# Patient Record
Sex: Male | Born: 1940
Health system: Southern US, Community
[De-identification: ages and names within clinical notes are randomized; demographics above are authoritative.]

## PROBLEM LIST (undated history)

## (undated) DIAGNOSIS — M1990 Unspecified osteoarthritis, unspecified site: Secondary | ICD-10-CM

## (undated) DIAGNOSIS — R918 Other nonspecific abnormal finding of lung field: Secondary | ICD-10-CM

## (undated) DIAGNOSIS — I10 Essential (primary) hypertension: Secondary | ICD-10-CM

## (undated) DIAGNOSIS — R972 Elevated prostate specific antigen [PSA]: Secondary | ICD-10-CM

## (undated) DIAGNOSIS — E7800 Pure hypercholesterolemia, unspecified: Secondary | ICD-10-CM

## (undated) DIAGNOSIS — N400 Enlarged prostate without lower urinary tract symptoms: Secondary | ICD-10-CM

## (undated) HISTORY — DX: Pure hypercholesterolemia, unspecified: E78.00

## (undated) HISTORY — PX: TONSILLECTOMY: SUR1361

## (undated) HISTORY — DX: Elevated prostate specific antigen (PSA): R97.20

## (undated) HISTORY — DX: Other nonspecific abnormal finding of lung field: R91.8

## (undated) HISTORY — PX: APPENDECTOMY: SHX54

## (undated) HISTORY — DX: Benign prostatic hyperplasia without lower urinary tract symptoms: N40.0

---

## 2006-06-10 ENCOUNTER — Ambulatory Visit (HOSPITAL_COMMUNITY): Admission: RE | Admit: 2006-06-10 | Discharge: 2006-06-10 | Payer: Self-pay | Admitting: Urology

## 2006-06-16 ENCOUNTER — Inpatient Hospital Stay (HOSPITAL_COMMUNITY): Admission: RE | Admit: 2006-06-16 | Discharge: 2006-06-17 | Payer: Self-pay | Admitting: Urology

## 2006-06-16 ENCOUNTER — Encounter (INDEPENDENT_AMBULATORY_CARE_PROVIDER_SITE_OTHER): Payer: Self-pay | Admitting: Specialist

## 2008-05-04 ENCOUNTER — Ambulatory Visit: Payer: Self-pay | Admitting: Cardiology

## 2010-02-27 ENCOUNTER — Ambulatory Visit (HOSPITAL_COMMUNITY)
Admission: RE | Admit: 2010-02-27 | Discharge: 2010-02-27 | Payer: Self-pay | Source: Home / Self Care | Admitting: Orthopedic Surgery

## 2010-04-28 ENCOUNTER — Other Ambulatory Visit: Payer: Self-pay | Admitting: Thoracic Surgery

## 2010-04-28 DIAGNOSIS — R911 Solitary pulmonary nodule: Secondary | ICD-10-CM

## 2010-04-30 ENCOUNTER — Ambulatory Visit
Admission: RE | Admit: 2010-04-30 | Discharge: 2010-04-30 | Disposition: A | Discharge status: Home / Self Care | Payer: No Typology Code available for payment source | Source: Ambulatory Visit | Attending: Thoracic Surgery | Admitting: Thoracic Surgery

## 2010-04-30 ENCOUNTER — Encounter: Payer: Medicare Other | Admitting: Thoracic Surgery

## 2010-04-30 DIAGNOSIS — R911 Solitary pulmonary nodule: Secondary | ICD-10-CM

## 2010-04-30 DIAGNOSIS — D381 Neoplasm of uncertain behavior of trachea, bronchus and lung: Secondary | ICD-10-CM

## 2010-05-12 NOTE — Letter (Signed)
April 30, 2010  Xaje A. Hasanaj, MD [address] [place], [place]  [address]  Re:  [name], [name]              DOB:  June 14, 1940  Dear Dr. Olena Leatherwood,  Mr. Mcadory came for followup of pulmonary nodules.  He apparently had prostate cancer 2 years ago when he had prostate surgery.  A CT scan revealed some left lower lobe nodules that were 7 mm in size and marginated and this has not changed in size.  He was not followed up and then recently, they have discovered that there has not been a followup when he was trying to get insurance.  For this reason, he is sent here for another evaluation.  We got a CT scan today that showed 2 nodules on the left side which  was 7.4 mm and had not changed in size.  However, there were 2 nodules on the right side, one was 7 mm in the right lower lobe and a 7 mm in that left upper lobe that also was slightly marginated and looks similar to the one on the left side.  He has also a small vertebral hemangioma over T12.  No other lesions were seen.  There was no adenopathy.  The patient is a known smoker, quit smoking 28 years ago.  Does not drink alcohol on a regular basis.  According to the radiologist recommendations, which I agree with, he is at low risk for cancer and so we could probably ought to repeat this and do at least 1 more followup of this in between 6 to 9 months, so I will schedule him for 9 months for followup.  He had no hemoptysis, fever, chills, excessive sputum.  MEDICATIONS:  Simvastatin 40 mg a day, doxycycline 100 mg twice a day, prednisone 20 mg a day, and he has also been on his Keflex 5 mg twice a day.  ALLERGIES:  He has no allergies.  PAST MEDICAL HISTORY:  He has hypercholesterolemia.  FAMILY HISTORY:  Noncontributory.  SOCIAL HISTORY:  He is married.  He has 1 child.  He quit smoking 28 years ago.  REVIEW OF SYSTEMS:  VITAL SIGNS:  He is 196 pounds, he is 5 feet 11 inches.  GENERAL:  His weight has been stable.   CARDIAC:  He had showed no angina or atrial fibrillation.  He has some shortness of breath with exertion.  PULMONARY:  See history of present illness.  GI:  No nausea, vomiting, constipation, or diarrhea.  GU:  No kidney disease, dysuria, or frequent urination.  VASCULAR:  No claudication, DVT, TIAs. NEUROLOGIC:  No dizziness, headaches, blackouts, seizures. MUSCULOSKELETAL:  No joint pain or arthritis.  PSYCHIATRIC:  No depression or nervousness.  ENT:  He has no changes in eyesight or hearing.  HEMATOLOGIC:  No problems with bleeding or clotting disorders.  HABITS:  GENERAL:  He is a well-developed Caucasian male in no acute distress. VITAL SIGNS:  His blood pressure is 141/90, pulse 78, respirations 20, sats were 95%. HEAD, EYES, EARS, NOSE, AND THROAT:  Unremarkable. NECK:  Supple without thyromegaly.  There is no supraclavicular or axillary adenopathy. CHEST:  Clear to auscultation and percussion. HEART:  Regular sinus rhythm.  No murmurs. ABDOMEN:  Soft.  There is no hepatosplenomegaly. EXTREMITIES:  Pulses are 2+.  There is no clubbing or edema. NEUROLOGIC:  She is oriented x3.  Sensory and motor are intact.  Cranial nerves intact.  As mentioned, I think  this is probably stable pulmonary nodules but we do need to get another definite followup in 9 months.  I will schedule this for followup here at our office.  I explained this in great detail to he and his family and they agree.  I appreciate the opportunity of seeing Mr. Natal.  Ines Bloomer, M.D. Electronically Signed  DPB/MEDQ  D:  04/30/2010  T:  05/01/2010  Job:  [id number]  cc:   Dr. Kathrynn Ducking

## 2010-07-23 DIAGNOSIS — Z0279 Encounter for issue of other medical certificate: Secondary | ICD-10-CM

## 2010-08-15 NOTE — H&P (Signed)
[name], [name]              ACCOUNT NO.:  [id number]   MEDICAL RECORD NO.:  [id number]          PATIENT TYPE:  INP   LOCATION:  A315                          FACILITY:  [facility]   PHYSICIAN:  Ky Barban, M.D.DATE OF BIRTH:  [birth year]   DATE OF ADMISSION:  DATE OF DISCHARGE:  LH                              HISTORY & PHYSICAL   CHIEF COMPLAINT:  Symptoms of prostatism.   HISTORY:  This 70 year old gentleman has recurrent urinary tract  infections. Workup showed that he has enlarged prostate causing bladder  neck obstruction.  He does have symptoms of prostatism which has been  going on for a long time with slow and weak stream, nocturia, frequency.  Two weeks ago he had urinary tract infection.  He had fever and chills.  Treated with antibiotics.  He was placed on Proscar and Flomax.  His  symptoms have not improved much.  Cystoscopy shows that he has enlarged  prostate, but I did not see very significantly enlarged prostate.  There  is tight bladder neck, so I advised him to undergo holmium laser  ablation.  Procedure and complications discussed.  They understand and  want me to go ahead and schedule.   The other problem is that his PSA is also elevated to 10.9.  I want to  get rid of these symptoms of prostatism and see in 2-3 months if PSA  comes down.  If not, then we will have to do a biopsy.  He understands  that.   PAST MEDICAL HISTORY:  1. Appendectomy in 1972.  2. Vasectomy in 1977.  3. Scrotal cyst removed in 2002.   PERSONAL HISTORY:  Does not smoke or drink.   REVIEW OF SYSTEMS:  Unremarkable.   PHYSICAL EXAMINATION:  VITAL SIGNS: Blood pressure 120/80, temperature  normal.  CENTRAL NERVOUS SYSTEM:  No gross neurological deficit.  HEAD, NECK, EYES, ENT:  Negative.  CHEST: Symmetrical.  HEART:  Regular.  S1, S2 normal.  No murmur.  ABDOMEN:  Soft, flat.  Liver, spleen, kidneys are not palpable.  No CVA  tenderness.  EXTERNAL GENITALIA:  He is  uncircumcised.  Meatus is adequate.  Testicles are normal.  RECTAL: Prostate 1.5 plus, smooth and firm.   IMPRESSION:  1. Benign prostatic hypertrophy.  2. Bladder neck obstruction.  3. Recurrent urinary tract infections.   PLAN:  Holmium laser ablation of the prostate and keep him overnight in  the hospital.      Ky Barban, M.D.  Electronically Signed     MIJ/MEDQ  D:  06/15/2006  T:  06/15/2006  Job:  [id number]

## 2010-08-15 NOTE — Discharge Summary (Signed)
[name], [place]              ACCOUNT NO.:  [id number]   MEDICAL RECORD NO.:  [id number]          PATIENT TYPE:  INP   LOCATION:  A315                          FACILITY:  [facility]   PHYSICIAN:  Ky Barban, M.D.DATE OF BIRTH:  12-10-1940   DATE OF ADMISSION:  06/16/2006  DATE OF DISCHARGE:  03/20/2008LH                               DISCHARGE SUMMARY   HISTORY OF PRESENT ILLNESS:  A 70 year old gentleman has longstanding  history of prostatism he was worked up in the office and he has enlarged  prostate causing bladder neck obstruction.  His flow rate is very slow,  and I advised him to undergo Holmium laser ablation of the prostate.  He  also has recent urinary tract infection.  He has been treated with  Flomax and Proscar, but continued to be symptomatic.  He also has  elevated PSA of 10.9.  his bladder does not look empty, so I have told  him that after his bladder starts emptying we will check the PSA again.  If continues to be high, then he would need a biopsy.  Otherwise, he is  in good medical condition.   HOSPITAL COURSE:  A routine preadmission workup CBC, urinalysis, SMA7  were done, which were normal.  He was taken to the operating room.  Holmium laser ablation of the prostate was done but during the procedure  I found out that he still has significant tissue, so I decided to resect  that, especially I need some also tissue from his prostate for biopsy  purposes.  Although, we will not do the GI workup and needle biopsy  transrectally, but it is still good to have this tissue.  So, I decided  to go ahead and do TUR prostate which was done.  Postop course was  benign.  His pathology report came back as BPH.  By next day, his urine  is clear.  We took out the Foley catheter.  He is voiding fine.  So, I  am going to discharge him home and will follow-up him in the office.   FINAL DISCHARGE DIAGNOSIS:  1. Benign prostatic hypertrophy.  2. Elevated PSA.   PLAN:  I  will him back in two weeks in the office.  I told him if  feeling fever to let me know.      Ky Barban, M.D.  Electronically Signed     MIJ/MEDQ  D:  07/01/2006  T:  07/01/2006  Job:  [id number]

## 2010-08-15 NOTE — Op Note (Signed)
[name], [name]              ACCOUNT NO.:  [id number]   MEDICAL RECORD NO.:  [id number]          PATIENT TYPE:  INP   LOCATION:  A315                          FACILITY:  [facility]   PHYSICIAN:  Ky Barban, M.D.DATE OF BIRTH:  1941/02/22   DATE OF PROCEDURE:  06/16/2006  DATE OF DISCHARGE:                               OPERATIVE REPORT   PREOPERATIVE DIAGNOSIS:  Benign prostatic hypertrophy, elevated PSA.   POSTOPERATIVE DIAGNOSIS:  Benign prostatic hypertrophy, elevated PSA.   PROCEDURE:  Holmium laser ablation of the prostate, transurethral  resection of the prostate.   ANESTHESIA:  Spinal.   DESCRIPTION OF PROCEDURE:  The patient was given spinal anesthesia,  placed in lithotomy position, prepped and draped.  A #26 Iglesias  resectoscope sheath was introduced into the bladder.  It was inspected.  It was pulled back in the mid prostatic urethra.  Then, I introduced the  laser bridge into the resectoscope sheath and using a laser fiber,  ablated the bladder neck circumferentially.  The resectoscope sheath was  pulled back to the level of the verumontanum.  The right lobe was  ablated between 11 and 7 o'clock position.  Similarly, the left lobe was  ablated between the 1 and 5 o'clock position.  I see a lot of flaky  tissue floating around and there is considerable prostatic tissue.  This  patient has elevated PSA, so I decided to go ahead and resect this  tissue to make it smooth.  So, I introduced #26 working element with the  loop into it and the right and left lobe of the prostate was carefully  resected making sure not to injure the sphincter or the verumontanum.  After this, I used the laser again to cauterize any bleeders and the  prostatic urethra looks wide open.  There were a lot of prostatic chips  which were evacuated with the help of the [facility] evacuator.  Now, the  resectoscope was removed and a 22 three way Foley catheter was left in  for drainage, through  and through started which is clear.  The patient  left the operating room in satisfactory condition.      Ky Barban, M.D.  Electronically Signed     MIJ/MEDQ  D:  06/16/2006  T:  06/16/2006  Job:  [id number]

## 2010-12-19 ENCOUNTER — Other Ambulatory Visit: Payer: Self-pay | Admitting: Thoracic Surgery

## 2010-12-19 DIAGNOSIS — D381 Neoplasm of uncertain behavior of trachea, bronchus and lung: Secondary | ICD-10-CM

## 2011-01-21 ENCOUNTER — Encounter: Payer: Self-pay | Admitting: Thoracic Surgery

## 2011-01-27 DIAGNOSIS — N400 Enlarged prostate without lower urinary tract symptoms: Secondary | ICD-10-CM | POA: Insufficient documentation

## 2011-01-27 DIAGNOSIS — E7800 Pure hypercholesterolemia, unspecified: Secondary | ICD-10-CM | POA: Insufficient documentation

## 2011-01-27 DIAGNOSIS — R972 Elevated prostate specific antigen [PSA]: Secondary | ICD-10-CM | POA: Insufficient documentation

## 2011-01-27 DIAGNOSIS — R918 Other nonspecific abnormal finding of lung field: Secondary | ICD-10-CM | POA: Insufficient documentation

## 2011-01-27 DIAGNOSIS — E785 Hyperlipidemia, unspecified: Secondary | ICD-10-CM | POA: Insufficient documentation

## 2011-01-28 ENCOUNTER — Ambulatory Visit
Admission: RE | Admit: 2011-01-28 | Discharge: 2011-01-28 | Disposition: A | Discharge status: Home / Self Care | Payer: Medicare Other | Source: Ambulatory Visit | Attending: Thoracic Surgery | Admitting: Thoracic Surgery

## 2011-01-28 ENCOUNTER — Other Ambulatory Visit: Payer: No Typology Code available for payment source

## 2011-01-28 ENCOUNTER — Encounter: Payer: Self-pay | Admitting: Thoracic Surgery

## 2011-01-28 ENCOUNTER — Ambulatory Visit (INDEPENDENT_AMBULATORY_CARE_PROVIDER_SITE_OTHER): Payer: Medicare Other | Admitting: Thoracic Surgery

## 2011-01-28 ENCOUNTER — Ambulatory Visit: Payer: Medicare Other | Admitting: Thoracic Surgery

## 2011-01-28 VITALS — BP 129/80 | HR 65 | Resp 18 | Ht 71.0 in | Wt 180.0 lb

## 2011-01-28 DIAGNOSIS — D381 Neoplasm of uncertain behavior of trachea, bronchus and lung: Secondary | ICD-10-CM

## 2011-01-28 DIAGNOSIS — R918 Other nonspecific abnormal finding of lung field: Secondary | ICD-10-CM

## 2011-01-28 NOTE — Progress Notes (Signed)
HPI [name] returns today for followup of his lung nodules. His PSA has been stable he has no medical problems. CT scan showed no change in the multiple nodules. We will see him back in 6 months for final check.   Current Outpatient Prescriptions  Medication Sig Dispense Refill  . CELEBREX 200 MG capsule 200 mg daily.       . simvastatin (ZOCOR) 40 MG tablet Take 40 mg by mouth at bedtime.        Marland Kitchen ZOSTAVAX [address] UNT/0.65ML injection          Review of Systems: Unchanged   Physical Exam  Cardiovascular: Normal rate, regular rhythm and normal heart sounds.   Pulmonary/Chest: Effort normal and breath sounds normal. No respiratory distress.     Diagnostic Tests: CT scan shows that the 2 nodules in the left and the 2 nodules on the right were unchanged   Impression: Prostate cancer multiple pulmonary nodules stable   Plan: Return in 6 months with a CT scan

## 2011-02-25 DIAGNOSIS — Z0271 Encounter for disability determination: Secondary | ICD-10-CM

## 2011-04-21 DIAGNOSIS — J449 Chronic obstructive pulmonary disease, unspecified: Secondary | ICD-10-CM | POA: Diagnosis not present

## 2011-04-21 DIAGNOSIS — E782 Mixed hyperlipidemia: Secondary | ICD-10-CM | POA: Diagnosis not present

## 2011-04-21 DIAGNOSIS — H65199 Other acute nonsuppurative otitis media, unspecified ear: Secondary | ICD-10-CM | POA: Diagnosis not present

## 2011-04-23 DIAGNOSIS — Z23 Encounter for immunization: Secondary | ICD-10-CM | POA: Diagnosis not present

## 2011-05-29 DIAGNOSIS — J019 Acute sinusitis, unspecified: Secondary | ICD-10-CM | POA: Diagnosis not present

## 2011-05-29 DIAGNOSIS — R51 Headache: Secondary | ICD-10-CM | POA: Diagnosis not present

## 2011-05-29 DIAGNOSIS — J209 Acute bronchitis, unspecified: Secondary | ICD-10-CM | POA: Diagnosis not present

## 2011-06-03 ENCOUNTER — Other Ambulatory Visit: Payer: Self-pay | Admitting: Thoracic Surgery

## 2011-06-03 DIAGNOSIS — D381 Neoplasm of uncertain behavior of trachea, bronchus and lung: Secondary | ICD-10-CM

## 2011-07-03 ENCOUNTER — Other Ambulatory Visit: Payer: Self-pay | Admitting: Cardiothoracic Surgery

## 2011-07-07 ENCOUNTER — Encounter: Payer: Self-pay | Admitting: Thoracic Surgery

## 2011-07-07 ENCOUNTER — Ambulatory Visit (INDEPENDENT_AMBULATORY_CARE_PROVIDER_SITE_OTHER): Payer: Medicare Other | Admitting: Thoracic Surgery

## 2011-07-07 ENCOUNTER — Ambulatory Visit: Payer: Medicare Other | Admitting: Thoracic Surgery

## 2011-07-07 ENCOUNTER — Ambulatory Visit
Admission: RE | Admit: 2011-07-07 | Discharge: 2011-07-07 | Disposition: A | Discharge status: Home / Self Care | Payer: Medicare Other | Source: Ambulatory Visit | Attending: Thoracic Surgery | Admitting: Thoracic Surgery

## 2011-07-07 VITALS — BP 123/68 | HR 80 | Resp 18 | Ht 71.0 in | Wt 186.0 lb

## 2011-07-07 DIAGNOSIS — R918 Other nonspecific abnormal finding of lung field: Secondary | ICD-10-CM | POA: Diagnosis not present

## 2011-07-07 DIAGNOSIS — D381 Neoplasm of uncertain behavior of trachea, bronchus and lung: Secondary | ICD-10-CM

## 2011-07-07 NOTE — Progress Notes (Signed)
HPI patient returns for followup today. CT scan shows no change in the multiple nodules these and been stable for over 2 years. We will let them be followed up by the medical Dr. will be happy to see him again should  anything change. He has had no change in his symptoms since we saw him last. I feel these nodules are probably benign probably granulomas.   Current Outpatient Prescriptions  Medication Sig Dispense Refill  . CELEBREX 200 MG capsule 200 mg daily.       . simvastatin (ZOCOR) 40 MG tablet Take 40 mg by mouth at bedtime.        Marland Kitchen ZOSTAVAX 46962 UNT/0.65ML injection          Review of Systems: Unchanged   Physical Exam chest is clear to auscultation percussion    Diagnostic Tests: CT scan shows no change in the multiple nodules they thought that one might be slightly more prominent   Impression: multiple lung nodules probably benign    PlanFollowup by PCP:

## 2011-07-21 DIAGNOSIS — J449 Chronic obstructive pulmonary disease, unspecified: Secondary | ICD-10-CM | POA: Diagnosis not present

## 2011-07-23 DIAGNOSIS — Z0271 Encounter for disability determination: Secondary | ICD-10-CM

## 2011-10-29 DIAGNOSIS — Z79899 Other long term (current) drug therapy: Secondary | ICD-10-CM | POA: Diagnosis not present

## 2011-10-29 DIAGNOSIS — N390 Urinary tract infection, site not specified: Secondary | ICD-10-CM | POA: Diagnosis not present

## 2011-10-29 DIAGNOSIS — E876 Hypokalemia: Secondary | ICD-10-CM | POA: Diagnosis not present

## 2011-11-02 DIAGNOSIS — N3000 Acute cystitis without hematuria: Secondary | ICD-10-CM | POA: Diagnosis not present

## 2011-11-02 DIAGNOSIS — E782 Mixed hyperlipidemia: Secondary | ICD-10-CM | POA: Diagnosis not present

## 2012-01-18 DIAGNOSIS — T1500XA Foreign body in cornea, unspecified eye, initial encounter: Secondary | ICD-10-CM | POA: Diagnosis not present

## 2012-02-02 DIAGNOSIS — E782 Mixed hyperlipidemia: Secondary | ICD-10-CM | POA: Diagnosis not present

## 2012-02-17 DIAGNOSIS — H538 Other visual disturbances: Secondary | ICD-10-CM | POA: Diagnosis not present

## 2012-02-17 DIAGNOSIS — H2510 Age-related nuclear cataract, unspecified eye: Secondary | ICD-10-CM | POA: Diagnosis not present

## 2012-04-23 DIAGNOSIS — J019 Acute sinusitis, unspecified: Secondary | ICD-10-CM | POA: Diagnosis not present

## 2012-04-23 DIAGNOSIS — J209 Acute bronchitis, unspecified: Secondary | ICD-10-CM | POA: Diagnosis not present

## 2012-05-06 DIAGNOSIS — I10 Essential (primary) hypertension: Secondary | ICD-10-CM | POA: Diagnosis not present

## 2012-05-06 DIAGNOSIS — Z0000 Encounter for general adult medical examination without abnormal findings: Secondary | ICD-10-CM | POA: Diagnosis not present

## 2012-05-06 DIAGNOSIS — Z125 Encounter for screening for malignant neoplasm of prostate: Secondary | ICD-10-CM | POA: Diagnosis not present

## 2012-05-06 DIAGNOSIS — E782 Mixed hyperlipidemia: Secondary | ICD-10-CM | POA: Diagnosis not present

## 2012-05-09 DIAGNOSIS — Z23 Encounter for immunization: Secondary | ICD-10-CM | POA: Diagnosis not present

## 2012-08-01 DIAGNOSIS — H2510 Age-related nuclear cataract, unspecified eye: Secondary | ICD-10-CM | POA: Diagnosis not present

## 2012-08-05 DIAGNOSIS — E782 Mixed hyperlipidemia: Secondary | ICD-10-CM | POA: Diagnosis not present

## 2012-08-15 DIAGNOSIS — H269 Unspecified cataract: Secondary | ICD-10-CM | POA: Diagnosis not present

## 2012-08-15 DIAGNOSIS — H2510 Age-related nuclear cataract, unspecified eye: Secondary | ICD-10-CM | POA: Diagnosis not present

## 2012-08-30 DIAGNOSIS — H2510 Age-related nuclear cataract, unspecified eye: Secondary | ICD-10-CM | POA: Diagnosis not present

## 2012-09-01 DIAGNOSIS — H269 Unspecified cataract: Secondary | ICD-10-CM | POA: Diagnosis not present

## 2012-09-01 DIAGNOSIS — H52229 Regular astigmatism, unspecified eye: Secondary | ICD-10-CM | POA: Diagnosis not present

## 2012-09-01 DIAGNOSIS — H2510 Age-related nuclear cataract, unspecified eye: Secondary | ICD-10-CM | POA: Diagnosis not present

## 2012-11-02 DIAGNOSIS — IMO0002 Reserved for concepts with insufficient information to code with codable children: Secondary | ICD-10-CM | POA: Diagnosis not present

## 2012-11-02 DIAGNOSIS — M48061 Spinal stenosis, lumbar region without neurogenic claudication: Secondary | ICD-10-CM | POA: Diagnosis not present

## 2012-11-02 DIAGNOSIS — M545 Low back pain: Secondary | ICD-10-CM | POA: Diagnosis not present

## 2012-11-02 DIAGNOSIS — M5137 Other intervertebral disc degeneration, lumbosacral region: Secondary | ICD-10-CM | POA: Diagnosis not present

## 2012-11-11 DIAGNOSIS — M47817 Spondylosis without myelopathy or radiculopathy, lumbosacral region: Secondary | ICD-10-CM | POA: Diagnosis not present

## 2012-11-11 DIAGNOSIS — M5137 Other intervertebral disc degeneration, lumbosacral region: Secondary | ICD-10-CM | POA: Diagnosis not present

## 2012-11-30 DIAGNOSIS — M5137 Other intervertebral disc degeneration, lumbosacral region: Secondary | ICD-10-CM | POA: Diagnosis not present

## 2012-12-14 DIAGNOSIS — M5137 Other intervertebral disc degeneration, lumbosacral region: Secondary | ICD-10-CM | POA: Diagnosis not present

## 2013-01-02 DIAGNOSIS — M5137 Other intervertebral disc degeneration, lumbosacral region: Secondary | ICD-10-CM | POA: Diagnosis not present

## 2013-03-13 DIAGNOSIS — E782 Mixed hyperlipidemia: Secondary | ICD-10-CM | POA: Diagnosis not present

## 2013-03-13 DIAGNOSIS — Z125 Encounter for screening for malignant neoplasm of prostate: Secondary | ICD-10-CM | POA: Diagnosis not present

## 2013-03-30 HISTORY — PX: CATARACT EXTRACTION: SUR2

## 2013-04-24 DIAGNOSIS — M545 Low back pain, unspecified: Secondary | ICD-10-CM | POA: Diagnosis not present

## 2013-04-24 DIAGNOSIS — M5137 Other intervertebral disc degeneration, lumbosacral region: Secondary | ICD-10-CM | POA: Diagnosis not present

## 2013-04-24 DIAGNOSIS — G894 Chronic pain syndrome: Secondary | ICD-10-CM | POA: Diagnosis not present

## 2013-04-24 DIAGNOSIS — M48061 Spinal stenosis, lumbar region without neurogenic claudication: Secondary | ICD-10-CM | POA: Diagnosis not present

## 2013-05-03 DIAGNOSIS — M545 Low back pain, unspecified: Secondary | ICD-10-CM | POA: Diagnosis not present

## 2013-05-04 DIAGNOSIS — M999 Biomechanical lesion, unspecified: Secondary | ICD-10-CM | POA: Diagnosis not present

## 2013-05-04 DIAGNOSIS — M5430 Sciatica, unspecified side: Secondary | ICD-10-CM | POA: Diagnosis not present

## 2013-05-04 DIAGNOSIS — M47817 Spondylosis without myelopathy or radiculopathy, lumbosacral region: Secondary | ICD-10-CM | POA: Diagnosis not present

## 2013-05-08 DIAGNOSIS — M999 Biomechanical lesion, unspecified: Secondary | ICD-10-CM | POA: Diagnosis not present

## 2013-05-08 DIAGNOSIS — M5430 Sciatica, unspecified side: Secondary | ICD-10-CM | POA: Diagnosis not present

## 2013-05-08 DIAGNOSIS — M47817 Spondylosis without myelopathy or radiculopathy, lumbosacral region: Secondary | ICD-10-CM | POA: Diagnosis not present

## 2013-05-11 DIAGNOSIS — M47817 Spondylosis without myelopathy or radiculopathy, lumbosacral region: Secondary | ICD-10-CM | POA: Diagnosis not present

## 2013-05-11 DIAGNOSIS — M999 Biomechanical lesion, unspecified: Secondary | ICD-10-CM | POA: Diagnosis not present

## 2013-05-11 DIAGNOSIS — M5430 Sciatica, unspecified side: Secondary | ICD-10-CM | POA: Diagnosis not present

## 2013-05-18 DIAGNOSIS — M47817 Spondylosis without myelopathy or radiculopathy, lumbosacral region: Secondary | ICD-10-CM | POA: Diagnosis not present

## 2013-05-18 DIAGNOSIS — M999 Biomechanical lesion, unspecified: Secondary | ICD-10-CM | POA: Diagnosis not present

## 2013-05-18 DIAGNOSIS — M5430 Sciatica, unspecified side: Secondary | ICD-10-CM | POA: Diagnosis not present

## 2013-05-23 DIAGNOSIS — M999 Biomechanical lesion, unspecified: Secondary | ICD-10-CM | POA: Diagnosis not present

## 2013-05-23 DIAGNOSIS — M5430 Sciatica, unspecified side: Secondary | ICD-10-CM | POA: Diagnosis not present

## 2013-05-23 DIAGNOSIS — M47817 Spondylosis without myelopathy or radiculopathy, lumbosacral region: Secondary | ICD-10-CM | POA: Diagnosis not present

## 2013-05-30 DIAGNOSIS — M999 Biomechanical lesion, unspecified: Secondary | ICD-10-CM | POA: Diagnosis not present

## 2013-05-30 DIAGNOSIS — M5430 Sciatica, unspecified side: Secondary | ICD-10-CM | POA: Diagnosis not present

## 2013-05-30 DIAGNOSIS — M47817 Spondylosis without myelopathy or radiculopathy, lumbosacral region: Secondary | ICD-10-CM | POA: Diagnosis not present

## 2013-06-06 DIAGNOSIS — M47817 Spondylosis without myelopathy or radiculopathy, lumbosacral region: Secondary | ICD-10-CM | POA: Diagnosis not present

## 2013-06-06 DIAGNOSIS — M5430 Sciatica, unspecified side: Secondary | ICD-10-CM | POA: Diagnosis not present

## 2013-06-06 DIAGNOSIS — M999 Biomechanical lesion, unspecified: Secondary | ICD-10-CM | POA: Diagnosis not present

## 2013-06-12 DIAGNOSIS — I10 Essential (primary) hypertension: Secondary | ICD-10-CM | POA: Diagnosis not present

## 2013-06-13 DIAGNOSIS — M47817 Spondylosis without myelopathy or radiculopathy, lumbosacral region: Secondary | ICD-10-CM | POA: Diagnosis not present

## 2013-06-13 DIAGNOSIS — M5430 Sciatica, unspecified side: Secondary | ICD-10-CM | POA: Diagnosis not present

## 2013-06-13 DIAGNOSIS — M999 Biomechanical lesion, unspecified: Secondary | ICD-10-CM | POA: Diagnosis not present

## 2013-06-20 DIAGNOSIS — M47817 Spondylosis without myelopathy or radiculopathy, lumbosacral region: Secondary | ICD-10-CM | POA: Diagnosis not present

## 2013-06-20 DIAGNOSIS — M999 Biomechanical lesion, unspecified: Secondary | ICD-10-CM | POA: Diagnosis not present

## 2013-06-20 DIAGNOSIS — M5430 Sciatica, unspecified side: Secondary | ICD-10-CM | POA: Diagnosis not present

## 2013-06-28 DIAGNOSIS — M47817 Spondylosis without myelopathy or radiculopathy, lumbosacral region: Secondary | ICD-10-CM | POA: Diagnosis not present

## 2013-06-28 DIAGNOSIS — M5430 Sciatica, unspecified side: Secondary | ICD-10-CM | POA: Diagnosis not present

## 2013-06-28 DIAGNOSIS — M999 Biomechanical lesion, unspecified: Secondary | ICD-10-CM | POA: Diagnosis not present

## 2013-07-05 DIAGNOSIS — M999 Biomechanical lesion, unspecified: Secondary | ICD-10-CM | POA: Diagnosis not present

## 2013-07-05 DIAGNOSIS — M5430 Sciatica, unspecified side: Secondary | ICD-10-CM | POA: Diagnosis not present

## 2013-07-05 DIAGNOSIS — M47817 Spondylosis without myelopathy or radiculopathy, lumbosacral region: Secondary | ICD-10-CM | POA: Diagnosis not present

## 2013-07-12 DIAGNOSIS — M5430 Sciatica, unspecified side: Secondary | ICD-10-CM | POA: Diagnosis not present

## 2013-07-12 DIAGNOSIS — M47817 Spondylosis without myelopathy or radiculopathy, lumbosacral region: Secondary | ICD-10-CM | POA: Diagnosis not present

## 2013-07-12 DIAGNOSIS — M999 Biomechanical lesion, unspecified: Secondary | ICD-10-CM | POA: Diagnosis not present

## 2013-07-18 DIAGNOSIS — M5430 Sciatica, unspecified side: Secondary | ICD-10-CM | POA: Diagnosis not present

## 2013-07-18 DIAGNOSIS — M999 Biomechanical lesion, unspecified: Secondary | ICD-10-CM | POA: Diagnosis not present

## 2013-07-18 DIAGNOSIS — M47817 Spondylosis without myelopathy or radiculopathy, lumbosacral region: Secondary | ICD-10-CM | POA: Diagnosis not present

## 2013-07-25 DIAGNOSIS — M999 Biomechanical lesion, unspecified: Secondary | ICD-10-CM | POA: Diagnosis not present

## 2013-07-25 DIAGNOSIS — M5430 Sciatica, unspecified side: Secondary | ICD-10-CM | POA: Diagnosis not present

## 2013-07-25 DIAGNOSIS — M47817 Spondylosis without myelopathy or radiculopathy, lumbosacral region: Secondary | ICD-10-CM | POA: Diagnosis not present

## 2013-08-01 DIAGNOSIS — M999 Biomechanical lesion, unspecified: Secondary | ICD-10-CM | POA: Diagnosis not present

## 2013-08-01 DIAGNOSIS — M47817 Spondylosis without myelopathy or radiculopathy, lumbosacral region: Secondary | ICD-10-CM | POA: Diagnosis not present

## 2013-08-01 DIAGNOSIS — M5430 Sciatica, unspecified side: Secondary | ICD-10-CM | POA: Diagnosis not present

## 2013-08-08 DIAGNOSIS — M5430 Sciatica, unspecified side: Secondary | ICD-10-CM | POA: Diagnosis not present

## 2013-08-08 DIAGNOSIS — M999 Biomechanical lesion, unspecified: Secondary | ICD-10-CM | POA: Diagnosis not present

## 2013-08-08 DIAGNOSIS — M47817 Spondylosis without myelopathy or radiculopathy, lumbosacral region: Secondary | ICD-10-CM | POA: Diagnosis not present

## 2013-08-15 DIAGNOSIS — M47817 Spondylosis without myelopathy or radiculopathy, lumbosacral region: Secondary | ICD-10-CM | POA: Diagnosis not present

## 2013-08-15 DIAGNOSIS — M5430 Sciatica, unspecified side: Secondary | ICD-10-CM | POA: Diagnosis not present

## 2013-08-15 DIAGNOSIS — M999 Biomechanical lesion, unspecified: Secondary | ICD-10-CM | POA: Diagnosis not present

## 2013-08-22 DIAGNOSIS — M999 Biomechanical lesion, unspecified: Secondary | ICD-10-CM | POA: Diagnosis not present

## 2013-08-22 DIAGNOSIS — M5430 Sciatica, unspecified side: Secondary | ICD-10-CM | POA: Diagnosis not present

## 2013-08-22 DIAGNOSIS — M47817 Spondylosis without myelopathy or radiculopathy, lumbosacral region: Secondary | ICD-10-CM | POA: Diagnosis not present

## 2013-08-28 DIAGNOSIS — L570 Actinic keratosis: Secondary | ICD-10-CM | POA: Diagnosis not present

## 2013-08-28 DIAGNOSIS — Z0000 Encounter for general adult medical examination without abnormal findings: Secondary | ICD-10-CM | POA: Diagnosis not present

## 2013-08-29 DIAGNOSIS — M47817 Spondylosis without myelopathy or radiculopathy, lumbosacral region: Secondary | ICD-10-CM | POA: Diagnosis not present

## 2013-08-29 DIAGNOSIS — M999 Biomechanical lesion, unspecified: Secondary | ICD-10-CM | POA: Diagnosis not present

## 2013-08-29 DIAGNOSIS — M5430 Sciatica, unspecified side: Secondary | ICD-10-CM | POA: Diagnosis not present

## 2013-09-04 DIAGNOSIS — H26499 Other secondary cataract, unspecified eye: Secondary | ICD-10-CM | POA: Diagnosis not present

## 2013-09-04 DIAGNOSIS — H43399 Other vitreous opacities, unspecified eye: Secondary | ICD-10-CM | POA: Diagnosis not present

## 2013-09-05 DIAGNOSIS — M5430 Sciatica, unspecified side: Secondary | ICD-10-CM | POA: Diagnosis not present

## 2013-09-05 DIAGNOSIS — M47817 Spondylosis without myelopathy or radiculopathy, lumbosacral region: Secondary | ICD-10-CM | POA: Diagnosis not present

## 2013-09-05 DIAGNOSIS — M999 Biomechanical lesion, unspecified: Secondary | ICD-10-CM | POA: Diagnosis not present

## 2013-09-12 DIAGNOSIS — M5430 Sciatica, unspecified side: Secondary | ICD-10-CM | POA: Diagnosis not present

## 2013-09-12 DIAGNOSIS — M999 Biomechanical lesion, unspecified: Secondary | ICD-10-CM | POA: Diagnosis not present

## 2013-09-12 DIAGNOSIS — M47817 Spondylosis without myelopathy or radiculopathy, lumbosacral region: Secondary | ICD-10-CM | POA: Diagnosis not present

## 2013-09-14 DIAGNOSIS — M5137 Other intervertebral disc degeneration, lumbosacral region: Secondary | ICD-10-CM | POA: Diagnosis not present

## 2013-09-14 DIAGNOSIS — M47817 Spondylosis without myelopathy or radiculopathy, lumbosacral region: Secondary | ICD-10-CM | POA: Diagnosis not present

## 2013-09-18 DIAGNOSIS — M5137 Other intervertebral disc degeneration, lumbosacral region: Secondary | ICD-10-CM | POA: Diagnosis not present

## 2013-09-18 DIAGNOSIS — M545 Low back pain, unspecified: Secondary | ICD-10-CM | POA: Diagnosis not present

## 2013-09-18 DIAGNOSIS — M4120 Other idiopathic scoliosis, site unspecified: Secondary | ICD-10-CM | POA: Diagnosis not present

## 2013-09-18 DIAGNOSIS — M47817 Spondylosis without myelopathy or radiculopathy, lumbosacral region: Secondary | ICD-10-CM | POA: Diagnosis not present

## 2013-09-18 DIAGNOSIS — M5126 Other intervertebral disc displacement, lumbar region: Secondary | ICD-10-CM | POA: Diagnosis not present

## 2013-09-19 DIAGNOSIS — M47817 Spondylosis without myelopathy or radiculopathy, lumbosacral region: Secondary | ICD-10-CM | POA: Diagnosis not present

## 2013-09-19 DIAGNOSIS — M999 Biomechanical lesion, unspecified: Secondary | ICD-10-CM | POA: Diagnosis not present

## 2013-09-19 DIAGNOSIS — M5430 Sciatica, unspecified side: Secondary | ICD-10-CM | POA: Diagnosis not present

## 2013-09-21 DIAGNOSIS — M48061 Spinal stenosis, lumbar region without neurogenic claudication: Secondary | ICD-10-CM | POA: Diagnosis not present

## 2013-10-03 DIAGNOSIS — M999 Biomechanical lesion, unspecified: Secondary | ICD-10-CM | POA: Diagnosis not present

## 2013-10-03 DIAGNOSIS — M47817 Spondylosis without myelopathy or radiculopathy, lumbosacral region: Secondary | ICD-10-CM | POA: Diagnosis not present

## 2013-10-03 DIAGNOSIS — M5430 Sciatica, unspecified side: Secondary | ICD-10-CM | POA: Diagnosis not present

## 2013-10-10 DIAGNOSIS — M999 Biomechanical lesion, unspecified: Secondary | ICD-10-CM | POA: Diagnosis not present

## 2013-10-10 DIAGNOSIS — M47817 Spondylosis without myelopathy or radiculopathy, lumbosacral region: Secondary | ICD-10-CM | POA: Diagnosis not present

## 2013-10-10 DIAGNOSIS — M5430 Sciatica, unspecified side: Secondary | ICD-10-CM | POA: Diagnosis not present

## 2013-10-17 DIAGNOSIS — M999 Biomechanical lesion, unspecified: Secondary | ICD-10-CM | POA: Diagnosis not present

## 2013-10-17 DIAGNOSIS — M47817 Spondylosis without myelopathy or radiculopathy, lumbosacral region: Secondary | ICD-10-CM | POA: Diagnosis not present

## 2013-10-17 DIAGNOSIS — M5430 Sciatica, unspecified side: Secondary | ICD-10-CM | POA: Diagnosis not present

## 2013-10-27 ENCOUNTER — Other Ambulatory Visit (HOSPITAL_COMMUNITY): Payer: Self-pay | Admitting: Orthopedic Surgery

## 2013-10-27 ENCOUNTER — Other Ambulatory Visit (HOSPITAL_COMMUNITY): Payer: Self-pay | Admitting: Orthopaedic Surgery

## 2013-11-06 DIAGNOSIS — M5430 Sciatica, unspecified side: Secondary | ICD-10-CM | POA: Diagnosis not present

## 2013-11-06 DIAGNOSIS — M47817 Spondylosis without myelopathy or radiculopathy, lumbosacral region: Secondary | ICD-10-CM | POA: Diagnosis not present

## 2013-11-06 DIAGNOSIS — M999 Biomechanical lesion, unspecified: Secondary | ICD-10-CM | POA: Diagnosis not present

## 2013-11-10 ENCOUNTER — Encounter (HOSPITAL_COMMUNITY): Payer: Self-pay | Admitting: Pharmacy Technician

## 2013-11-13 DIAGNOSIS — M47817 Spondylosis without myelopathy or radiculopathy, lumbosacral region: Secondary | ICD-10-CM | POA: Diagnosis not present

## 2013-11-13 DIAGNOSIS — M999 Biomechanical lesion, unspecified: Secondary | ICD-10-CM | POA: Diagnosis not present

## 2013-11-13 DIAGNOSIS — M5430 Sciatica, unspecified side: Secondary | ICD-10-CM | POA: Diagnosis not present

## 2013-11-13 NOTE — Pre-Procedure Instructions (Signed)
[name]  11/13/2013   Your procedure is scheduled on:  11/22/13  Report to [facility] Admitting at 1030 AM.  Call this number if you have problems the morning of surgery: [phone]   Remember:   Do not eat food or drink liquids after midnight.   Take these medicines the morning of surgery with A SIP OF WATER: none   Do not wear jewelry, make-up or nail polish.  Do not wear lotions, powders, or perfumes. You may wear deodorant.  Do not shave 48 hours prior to surgery. Men may shave face and neck.  Do not bring valuables to the hospital.  [facility] is not responsible                  for any belongings or valuables.               Contacts, dentures or bridgework may not be worn into surgery.  Leave suitcase in the car. After surgery it may be brought to your room.  For patients admitted to the hospital, discharge time is determined by your                treatment team.               Patients discharged the day of surgery will not be allowed to drive  home.  Name and phone number of your driver: family  Special Instructions: Shower using CHG 2 nights before surgery and the night before surgery.  If you shower the day of surgery use CHG.  Use special wash - you have one bottle of CHG for all showers.  You should use approximately 1/3 of the bottle for each shower.   Please read over the following fact sheets that you were given: Pain Booklet, Coughing and Deep Breathing, MRSA Information and Surgical Site Infection Prevention

## 2013-11-14 ENCOUNTER — Encounter (HOSPITAL_COMMUNITY): Payer: Self-pay

## 2013-11-14 ENCOUNTER — Encounter (HOSPITAL_COMMUNITY)
Admission: RE | Admit: 2013-11-14 | Discharge: 2013-11-14 | Disposition: A | Discharge status: Home / Self Care | Payer: Medicare Other | Source: Ambulatory Visit | Attending: Orthopaedic Surgery | Admitting: Orthopaedic Surgery

## 2013-11-14 DIAGNOSIS — M48061 Spinal stenosis, lumbar region without neurogenic claudication: Secondary | ICD-10-CM | POA: Diagnosis not present

## 2013-11-14 DIAGNOSIS — Z01818 Encounter for other preprocedural examination: Secondary | ICD-10-CM | POA: Diagnosis not present

## 2013-11-14 LAB — COMPREHENSIVE METABOLIC PANEL
ALBUMIN: 3.5 g/dL (ref 3.5–5.2)
ALK PHOS: 44 U/L (ref 39–117)
ALT: 21 U/L (ref 0–53)
AST: 23 U/L (ref 0–37)
Anion gap: 13 (ref 5–15)
BILIRUBIN TOTAL: 0.6 mg/dL (ref 0.3–1.2)
BUN: 17 mg/dL (ref 6–23)
CO2: 23 mEq/L (ref 19–32)
Calcium: 9.6 mg/dL (ref 8.4–10.5)
Chloride: 103 mEq/L (ref 96–112)
Creatinine, Ser: 0.79 mg/dL (ref 0.50–1.35)
GFR calc Af Amer: >90 mL/min (ref >90)
GFR calc non Af Amer: 88 mL/min — ABNORMAL LOW (ref >90)
Glucose, Bld: 107 mg/dL — ABNORMAL HIGH (ref 70–99)
POTASSIUM: 4.7 meq/L (ref 3.7–5.3)
SODIUM: 139 meq/L (ref 137–147)
Total Protein: 6.4 g/dL (ref 6.0–8.3)

## 2013-11-14 LAB — CBC
HCT: 40.4 % (ref 39.0–52.0)
Hemoglobin: 14 g/dL (ref 13.0–17.0)
MCH: 33.3 pg (ref 26.0–34.0)
MCHC: 34.7 g/dL (ref 30.0–36.0)
MCV: 96.2 fL (ref 78.0–100.0)
PLATELETS: 177 10*3/uL (ref 150–400)
RBC: 4.2 MIL/uL — ABNORMAL LOW (ref 4.22–5.81)
RDW: 12.6 % (ref 11.5–15.5)
WBC: 5.4 10*3/uL (ref 4.0–10.5)

## 2013-11-14 LAB — SURGICAL PCR SCREEN
MRSA, PCR: NEGATIVE
Staphylococcus aureus: NEGATIVE

## 2013-11-14 LAB — PROTIME-INR
INR: 0.91 (ref 0.00–1.49)
Prothrombin Time: 12.3 seconds (ref 11.6–15.2)

## 2013-11-17 NOTE — H&P (Signed)
[facility]   A Division of [employer] [facility]   [address], [place], [place] [address] Telephone: [phone]  Fax: [phone]     PATIENT: [name], [name]   MR#: [id number]  DOB: [birth year]       A 73 year old male referred by Dr. Sherrie Sport with persistent back pain and leg pain with leg weakness.  He owns [employer], does body work, has had chronic back pain for 15-16 years.  Has difficulty with standing, difficulty with walking, difficulty in the supine position, he has had epidurals, physical therapy, pain medications, anti-inflammatories.     CURRENT MEDICATIONS:  He normally takes Simvastatin plus multivitamins, and Celebrex currently for his anti-inflammatory.   ALLERGIES:  NO KNOWN DRUG ALLERGIES.    PAST MEDICAL/SURGICAL HISTORY:  Surgeries include knee arthroscopy, prostate surgery without cancer, previous vasectomy, appendectomy, tonsillectomy.     SOCIAL HISTORY:  Patient is married to his wife, [name].  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  His son works in the other part of the building, does IT sales professional.     FAMILY HISTORY:  Positive for throat and lung cancer, skin cancer, as well as COPD.     REVIEW OF SYSTEMS:  Positive for cataract problems and benign prostatitic hypertrophy.   PHYSICAL EXAMINATION:  Patient is alert and oriented, WD, WN, NAD, extraocular movement is intact.  No accessory muscle inspiratory effort.  Height is 5 feet 11 inches, weight 184.  Lungs are clear. Heart is clear.  Spine is straight.  There is mild sciatic notch tenderness.  Negative straight leg raising 90 degrees.  He is able to heel and toe walk.  Distal pulses are 2+.  No restriction with hip range of motion.  Negative fabere test.   RADIOGRAPHS:  X-rays demonstrate multilevel spondylosis with some retrolisthesis at L3-4.  MRI scan is available for review from 09/18/2013.  This shows some scoliosis,  multilevel spondylosis.  Patient has moderate lateral recess narrowing bilaterally at L4-5 worse on the right, moderate foraminal stenosis bilateral at 4-5, moderate lateral recess stenosis at 5-1, and moderate central stenosis at L4-5 and L5-S1.     PLAN:  He has been through anti-inflammatories, pain medication, multiple epidural injections over the past 3-years, a total of 5 injections, the last in January 2015, with only temporary relief.   Operative choice would be 2-level L4-5, L5-S1 decompression for central and lateral recess stenosis.  He would be in the hospital 1-2 nights, would be restricted from doing any lifting, bending for a number of weeks.  Problems with voiding with his history of prostate problems discussed.  Risks of anesthesia, risk of stroke, heart attack discussed.        Mark C. Lorin Mercy, M.D.    Auto-Authenticated by Thana Farr. Lorin Mercy, M.D.

## 2013-11-21 ENCOUNTER — Other Ambulatory Visit (HOSPITAL_COMMUNITY): Payer: Self-pay | Admitting: Orthopedic Surgery

## 2013-11-21 MED ORDER — CEFAZOLIN SODIUM-DEXTROSE 2-3 GM-% IV SOLR
2.0000 g | INTRAVENOUS | Status: AC
  Administered 2013-11-22: 2 g via INTRAVENOUS
  Filled 2013-11-21: qty 50

## 2013-11-22 ENCOUNTER — Inpatient Hospital Stay (HOSPITAL_COMMUNITY): Payer: Medicare Other

## 2013-11-22 ENCOUNTER — Encounter (HOSPITAL_COMMUNITY): Payer: Medicare Other | Admitting: Certified Registered Nurse Anesthetist

## 2013-11-22 ENCOUNTER — Observation Stay (HOSPITAL_COMMUNITY)
Admission: RE | Admit: 2013-11-22 | Discharge: 2013-11-23 | Disposition: A | Discharge status: Home / Self Care | Payer: Medicare Other | Source: Ambulatory Visit | Attending: Orthopaedic Surgery | Admitting: Orthopaedic Surgery

## 2013-11-22 ENCOUNTER — Encounter (HOSPITAL_COMMUNITY)
Admission: RE | Disposition: A | Discharge status: Home / Self Care | Payer: Self-pay | Source: Ambulatory Visit | Attending: Orthopaedic Surgery

## 2013-11-22 ENCOUNTER — Encounter (HOSPITAL_COMMUNITY): Payer: Self-pay | Admitting: *Deleted

## 2013-11-22 ENCOUNTER — Inpatient Hospital Stay (HOSPITAL_COMMUNITY): Payer: Medicare Other | Admitting: Certified Registered Nurse Anesthetist

## 2013-11-22 DIAGNOSIS — M47817 Spondylosis without myelopathy or radiculopathy, lumbosacral region: Secondary | ICD-10-CM | POA: Diagnosis not present

## 2013-11-22 DIAGNOSIS — M48061 Spinal stenosis, lumbar region without neurogenic claudication: Secondary | ICD-10-CM | POA: Diagnosis not present

## 2013-11-22 DIAGNOSIS — M4800 Spinal stenosis, site unspecified: Secondary | ICD-10-CM | POA: Diagnosis present

## 2013-11-22 DIAGNOSIS — Z87891 Personal history of nicotine dependence: Secondary | ICD-10-CM | POA: Diagnosis not present

## 2013-11-22 DIAGNOSIS — M5137 Other intervertebral disc degeneration, lumbosacral region: Secondary | ICD-10-CM | POA: Diagnosis not present

## 2013-11-22 DIAGNOSIS — M519 Unspecified thoracic, thoracolumbar and lumbosacral intervertebral disc disorder: Secondary | ICD-10-CM | POA: Diagnosis not present

## 2013-11-22 HISTORY — PX: LUMBAR LAMINECTOMY: SHX95

## 2013-11-22 LAB — URINALYSIS, ROUTINE W REFLEX MICROSCOPIC
Bilirubin Urine: NEGATIVE
Glucose, UA: NEGATIVE mg/dL
Hgb urine dipstick: NEGATIVE
Ketones, ur: NEGATIVE mg/dL
Leukocytes, UA: NEGATIVE
Nitrite: NEGATIVE
Protein, ur: NEGATIVE mg/dL
Specific Gravity, Urine: 1.015 (ref 1.005–1.030)
Urobilinogen, UA: 0.2 mg/dL (ref 0.0–1.0)
pH: 7.5 (ref 5.0–8.0)

## 2013-11-22 SURGERY — MICRODISCECTOMY LUMBAR LAMINECTOMY
Anesthesia: General | Site: Back

## 2013-11-22 MED ORDER — PROMETHAZINE HCL 25 MG/ML IJ SOLN
INTRAMUSCULAR | Status: AC
  Filled 2013-11-22: qty 1

## 2013-11-22 MED ORDER — PHENOL 1.4 % MT LIQD: 1.0000 | OROMUCOSAL | Status: DC | PRN

## 2013-11-22 MED ORDER — THROMBIN 20000 UNITS EX SOLR
CUTANEOUS | Status: AC
  Filled 2013-11-22: qty 20000

## 2013-11-22 MED ORDER — SODIUM CHLORIDE 0.9 % IV SOLN: 250.0000 mL | INTRAVENOUS | Status: DC

## 2013-11-22 MED ORDER — LACTATED RINGERS IV SOLN
INTRAVENOUS | Status: DC | PRN
  Administered 2013-11-22 (×2): via INTRAVENOUS

## 2013-11-22 MED ORDER — SODIUM CHLORIDE 0.9 % IJ SOLN: 3.0000 mL | Freq: Two times a day (BID) | INTRAMUSCULAR | Status: DC

## 2013-11-22 MED ORDER — OXYCODONE-ACETAMINOPHEN 5-325 MG PO TABS: 1.0000 | ORAL_TABLET | ORAL | Status: DC | PRN

## 2013-11-22 MED ORDER — KCL IN DEXTROSE-NACL 20-5-0.45 MEQ/L-%-% IV SOLN
INTRAVENOUS | Status: DC
  Administered 2013-11-22: 19:00:00 via INTRAVENOUS
  Filled 2013-11-22 (×4): qty 1000

## 2013-11-22 MED ORDER — ACETAMINOPHEN 650 MG RE SUPP
650.0000 mg | RECTAL | Status: DC | PRN
Start: 2013-11-22 — End: 2013-11-23

## 2013-11-22 MED ORDER — HYDROMORPHONE HCL PF 1 MG/ML IJ SOLN
INTRAMUSCULAR | Status: AC
  Filled 2013-11-22: qty 1

## 2013-11-22 MED ORDER — SIMVASTATIN 40 MG PO TABS
40.0000 mg | ORAL_TABLET | Freq: Every day | ORAL | Status: DC
  Administered 2013-11-22: 40 mg via ORAL
  Filled 2013-11-22 (×2): qty 1

## 2013-11-22 MED ORDER — MIDAZOLAM HCL 2 MG/2ML IJ SOLN
INTRAMUSCULAR | Status: AC
  Filled 2013-11-22: qty 2

## 2013-11-22 MED ORDER — PROMETHAZINE HCL 25 MG/ML IJ SOLN
6.2500 mg | INTRAMUSCULAR | Status: DC | PRN
  Administered 2013-11-22: 6.25 mg via INTRAVENOUS

## 2013-11-22 MED ORDER — ONDANSETRON HCL 4 MG/2ML IJ SOLN
INTRAMUSCULAR | Status: AC
  Filled 2013-11-22: qty 2

## 2013-11-22 MED ORDER — SURGIFOAM 100 EX MISC
CUTANEOUS | Status: DC | PRN
  Administered 2013-11-22: 14:00:00 via TOPICAL

## 2013-11-22 MED ORDER — FLEET ENEMA 7-19 GM/118ML RE ENEM: 1.0000 | ENEMA | Freq: Once | RECTAL | Status: AC | PRN

## 2013-11-22 MED ORDER — LIDOCAINE HCL (CARDIAC) 20 MG/ML IV SOLN
INTRAVENOUS | Status: AC
  Filled 2013-11-22: qty 5

## 2013-11-22 MED ORDER — GLYCOPYRROLATE 0.2 MG/ML IJ SOLN
INTRAMUSCULAR | Status: AC
  Filled 2013-11-22: qty 4

## 2013-11-22 MED ORDER — NEOSTIGMINE METHYLSULFATE 10 MG/10ML IV SOLN
INTRAVENOUS | Status: DC | PRN
  Administered 2013-11-22: 5 mg via INTRAVENOUS

## 2013-11-22 MED ORDER — HYDROCODONE-ACETAMINOPHEN 5-325 MG PO TABS: 1.0000 | ORAL_TABLET | ORAL | Status: DC | PRN

## 2013-11-22 MED ORDER — BISACODYL 10 MG RE SUPP: 10.0000 mg | Freq: Every day | RECTAL | Status: DC | PRN

## 2013-11-22 MED ORDER — ACETAMINOPHEN 325 MG PO TABS: 650.0000 mg | ORAL_TABLET | ORAL | Status: DC | PRN

## 2013-11-22 MED ORDER — METHOCARBAMOL 500 MG PO TABS: 500.0000 mg | ORAL_TABLET | Freq: Four times a day (QID) | ORAL | Status: DC | PRN

## 2013-11-22 MED ORDER — OXYCODONE-ACETAMINOPHEN 5-325 MG PO TABS
1.0000 | ORAL_TABLET | ORAL | Status: DC | PRN
  Administered 2013-11-23 (×2): 2 via ORAL
  Filled 2013-11-22 (×2): qty 2

## 2013-11-22 MED ORDER — HYDROMORPHONE HCL PF 1 MG/ML IJ SOLN
0.2500 mg | INTRAMUSCULAR | Status: DC | PRN
  Administered 2013-11-22 (×2): 0.5 mg via INTRAVENOUS

## 2013-11-22 MED ORDER — PROPOFOL 10 MG/ML IV BOLUS
INTRAVENOUS | Status: DC | PRN
  Administered 2013-11-22: 130 mg via INTRAVENOUS

## 2013-11-22 MED ORDER — ARTIFICIAL TEARS OP OINT
TOPICAL_OINTMENT | OPHTHALMIC | Status: AC
  Filled 2013-11-22: qty 3.5

## 2013-11-22 MED ORDER — CEFAZOLIN SODIUM 1-5 GM-% IV SOLN
1.0000 g | Freq: Three times a day (TID) | INTRAVENOUS | Status: AC
  Administered 2013-11-22 – 2013-11-23 (×2): 1 g via INTRAVENOUS
  Filled 2013-11-22 (×2): qty 50

## 2013-11-22 MED ORDER — ROCURONIUM BROMIDE 100 MG/10ML IV SOLN
INTRAVENOUS | Status: DC | PRN
  Administered 2013-11-22: 40 mg via INTRAVENOUS

## 2013-11-22 MED ORDER — FENTANYL CITRATE 0.05 MG/ML IJ SOLN
INTRAMUSCULAR | Status: AC
  Filled 2013-11-22: qty 5

## 2013-11-22 MED ORDER — ONDANSETRON HCL 4 MG/2ML IJ SOLN: 4.0000 mg | INTRAMUSCULAR | Status: DC | PRN

## 2013-11-22 MED ORDER — ZOLPIDEM TARTRATE 5 MG PO TABS: 5.0000 mg | ORAL_TABLET | Freq: Every evening | ORAL | Status: DC | PRN

## 2013-11-22 MED ORDER — FENTANYL CITRATE 0.05 MG/ML IJ SOLN
INTRAMUSCULAR | Status: DC | PRN
  Administered 2013-11-22 (×2): 100 ug via INTRAVENOUS

## 2013-11-22 MED ORDER — SODIUM CHLORIDE 0.9 % IJ SOLN: 3.0000 mL | INTRAMUSCULAR | Status: DC | PRN

## 2013-11-22 MED ORDER — MENTHOL 3 MG MT LOZG: 1.0000 | LOZENGE | OROMUCOSAL | Status: DC | PRN

## 2013-11-22 MED ORDER — ALBUMIN HUMAN 5 % IV SOLN
INTRAVENOUS | Status: DC | PRN
  Administered 2013-11-22: 14:00:00 via INTRAVENOUS

## 2013-11-22 MED ORDER — DOCUSATE SODIUM 100 MG PO CAPS
100.0000 mg | ORAL_CAPSULE | Freq: Two times a day (BID) | ORAL | Status: DC
  Administered 2013-11-22 – 2013-11-23 (×2): 100 mg via ORAL
  Filled 2013-11-22 (×2): qty 1

## 2013-11-22 MED ORDER — GLYCOPYRROLATE 0.2 MG/ML IJ SOLN
INTRAMUSCULAR | Status: DC | PRN
  Administered 2013-11-22: .6 mg via INTRAVENOUS

## 2013-11-22 MED ORDER — POLYETHYLENE GLYCOL 3350 17 G PO PACK: 17.0000 g | PACK | Freq: Every day | ORAL | Status: DC | PRN

## 2013-11-22 MED ORDER — MORPHINE SULFATE 2 MG/ML IJ SOLN
1.0000 mg | INTRAMUSCULAR | Status: DC | PRN
  Administered 2013-11-22: 2 mg via INTRAVENOUS
  Filled 2013-11-22: qty 1

## 2013-11-22 MED ORDER — METHOCARBAMOL 1000 MG/10ML IJ SOLN
500.0000 mg | Freq: Four times a day (QID) | INTRAVENOUS | Status: DC | PRN
  Filled 2013-11-22: qty 5

## 2013-11-22 MED ORDER — PHENYLEPHRINE HCL 10 MG/ML IJ SOLN
10.0000 mg | INTRAVENOUS | Status: DC | PRN
  Administered 2013-11-22: 20 ug/min via INTRAVENOUS

## 2013-11-22 MED ORDER — METHOCARBAMOL 500 MG PO TABS
500.0000 mg | ORAL_TABLET | Freq: Four times a day (QID) | ORAL | Status: DC | PRN
  Administered 2013-11-23: 500 mg via ORAL
  Filled 2013-11-22 (×2): qty 1

## 2013-11-22 MED ORDER — KETOROLAC TROMETHAMINE 30 MG/ML IJ SOLN
30.0000 mg | Freq: Three times a day (TID) | INTRAMUSCULAR | Status: DC
  Administered 2013-11-22 – 2013-11-23 (×2): 30 mg via INTRAVENOUS
  Filled 2013-11-22 (×3): qty 1

## 2013-11-22 MED ORDER — LIDOCAINE HCL (CARDIAC) 20 MG/ML IV SOLN
INTRAVENOUS | Status: DC | PRN
  Administered 2013-11-22: 60 mg via INTRAVENOUS

## 2013-11-22 MED ORDER — EPHEDRINE SULFATE 50 MG/ML IJ SOLN
INTRAMUSCULAR | Status: AC
  Filled 2013-11-22: qty 1

## 2013-11-22 MED ORDER — ONDANSETRON HCL 4 MG/2ML IJ SOLN
INTRAMUSCULAR | Status: DC | PRN
  Administered 2013-11-22: 4 mg via INTRAVENOUS

## 2013-11-22 MED ORDER — LACTATED RINGERS IV SOLN
INTRAVENOUS | Status: DC
  Administered 2013-11-22: 11:00:00 via INTRAVENOUS

## 2013-11-22 MED ORDER — ARTIFICIAL TEARS OP OINT
TOPICAL_OINTMENT | OPHTHALMIC | Status: DC | PRN
  Administered 2013-11-22: 1 via OPHTHALMIC

## 2013-11-22 MED ORDER — PANTOPRAZOLE SODIUM 40 MG IV SOLR
40.0000 mg | Freq: Every day | INTRAVENOUS | Status: DC
  Administered 2013-11-22: 40 mg via INTRAVENOUS
  Filled 2013-11-22 (×2): qty 40

## 2013-11-22 MED ORDER — BUPIVACAINE HCL (PF) 0.25 % IJ SOLN
INTRAMUSCULAR | Status: AC
  Filled 2013-11-22: qty 30

## 2013-11-22 MED ORDER — STERILE WATER FOR INJECTION IJ SOLN
INTRAMUSCULAR | Status: AC
  Filled 2013-11-22: qty 10

## 2013-11-22 MED ORDER — NEOSTIGMINE METHYLSULFATE 10 MG/10ML IV SOLN
INTRAVENOUS | Status: AC
  Filled 2013-11-22: qty 3

## 2013-11-22 MED ORDER — EPHEDRINE SULFATE 50 MG/ML IJ SOLN
INTRAMUSCULAR | Status: DC | PRN
  Administered 2013-11-22 (×3): 10 mg via INTRAVENOUS
  Administered 2013-11-22: 5 mg via INTRAVENOUS

## 2013-11-22 MED ORDER — BUPIVACAINE HCL (PF) 0.25 % IJ SOLN
INTRAMUSCULAR | Status: DC | PRN
  Administered 2013-11-22: 10 mL

## 2013-11-22 SURGICAL SUPPLY — 47 items
BUR ROUND FLUTED 4 SOFT TCH (BURR) IMPLANT
BUR ROUND FLUTED 4MM SOFT TCH (BURR)
CORDS BIPOLAR (ELECTRODE) ×3 IMPLANT
COVER SURGICAL LIGHT HANDLE (MISCELLANEOUS) ×3 IMPLANT
DERMABOND ADVANCED (GAUZE/BANDAGES/DRESSINGS) ×2
DERMABOND ADVANCED .7 DNX12 (GAUZE/BANDAGES/DRESSINGS) ×1 IMPLANT
DRAPE MICROSCOPE LEICA (MISCELLANEOUS) ×3 IMPLANT
DRAPE PROXIMA HALF (DRAPES) ×6 IMPLANT
DRSG EMULSION OIL 3X3 NADH (GAUZE/BANDAGES/DRESSINGS) ×3 IMPLANT
DRSG MEPILEX BORDER 4X4 (GAUZE/BANDAGES/DRESSINGS) ×3 IMPLANT
DRSG MEPILEX BORDER 4X8 (GAUZE/BANDAGES/DRESSINGS) ×3 IMPLANT
DURAPREP 26ML APPLICATOR (WOUND CARE) ×3 IMPLANT
DURASEAL SPINE SEALANT 3ML (MISCELLANEOUS) ×3 IMPLANT
ELECT REM PT RETURN 9FT ADLT (ELECTROSURGICAL) ×3
ELECTRODE REM PT RTRN 9FT ADLT (ELECTROSURGICAL) ×1 IMPLANT
GAUZE SPONGE 4X4 12PLY STRL (GAUZE/BANDAGES/DRESSINGS) ×3 IMPLANT
GLOVE BIOGEL PI IND STRL 7.5 (GLOVE) ×1 IMPLANT
GLOVE BIOGEL PI IND STRL 8 (GLOVE) ×1 IMPLANT
GLOVE BIOGEL PI INDICATOR 7.5 (GLOVE) ×2
GLOVE BIOGEL PI INDICATOR 8 (GLOVE) ×2
GLOVE ECLIPSE 7.0 STRL STRAW (GLOVE) ×3 IMPLANT
GLOVE ORTHO TXT STRL SZ7.5 (GLOVE) ×3 IMPLANT
GOWN STRL REUS W/ TWL LRG LVL3 (GOWN DISPOSABLE) ×3 IMPLANT
GOWN STRL REUS W/TWL LRG LVL3 (GOWN DISPOSABLE) ×6
KIT BASIN OR (CUSTOM PROCEDURE TRAY) ×3 IMPLANT
KIT ROOM TURNOVER OR (KITS) ×3 IMPLANT
MANIFOLD NEPTUNE II (INSTRUMENTS) ×3 IMPLANT
NDL SUT .5 MAYO 1.404X.05X (NEEDLE) ×1 IMPLANT
NEEDLE 22X1 1/2 (OR ONLY) (NEEDLE) ×3 IMPLANT
NEEDLE MAYO TAPER (NEEDLE) ×2
NEEDLE SPNL 18GX3.5 QUINCKE PK (NEEDLE) ×3 IMPLANT
NS IRRIG 1000ML POUR BTL (IV SOLUTION) ×3 IMPLANT
PACK LAMINECTOMY ORTHO (CUSTOM PROCEDURE TRAY) ×3 IMPLANT
PAD ARMBOARD 7.5X6 YLW CONV (MISCELLANEOUS) ×6 IMPLANT
PATTIES SURGICAL .5 X.5 (GAUZE/BANDAGES/DRESSINGS) ×3 IMPLANT
PATTIES SURGICAL .75X.75 (GAUZE/BANDAGES/DRESSINGS) ×3 IMPLANT
SUT NURALON 4 0 TR CR/8 (SUTURE) ×3 IMPLANT
SUT VIC AB 2-0 CT1 27 (SUTURE) ×2
SUT VIC AB 2-0 CT1 TAPERPNT 27 (SUTURE) ×1 IMPLANT
SUT VICRYL 0 TIES 12 18 (SUTURE) ×3 IMPLANT
SUT VICRYL 4-0 PS2 18IN ABS (SUTURE) IMPLANT
SUT VICRYL AB 2 0 TIES (SUTURE) ×3 IMPLANT
SYR 20ML ECCENTRIC (SYRINGE) IMPLANT
SYR CONTROL 10ML LL (SYRINGE) ×3 IMPLANT
TOWEL OR 17X24 6PK STRL BLUE (TOWEL DISPOSABLE) ×3 IMPLANT
TOWEL OR 17X26 10 PK STRL BLUE (TOWEL DISPOSABLE) ×3 IMPLANT
WATER STERILE IRR 1000ML POUR (IV SOLUTION) ×3 IMPLANT

## 2013-11-22 NOTE — Brief Op Note (Cosign Needed)
11/22/2013  2:54 PM  PATIENT:  [name]  73 y.o. male  PRE-OPERATIVE DIAGNOSIS:  L4-5, L5-S1 Stenosis  POST-OPERATIVE DIAGNOSIS:  L4-5, L5-S1 Stenosis  PROCEDURE:  Procedure(s) with comments: MICRODISCECTOMY LUMBAR LAMINECTOMY (N/A) - L4-5, L5-S1 Decompression  SURGEON:  Surgeon(s) and Role:    * Marybelle Killings, MD - Primary  PHYSICIAN ASSISTANT: [facility]  ASSISTANTS: none   ANESTHESIA:   general  EBL:  Total I/O In: 7681 [I.V.:1200; IV Piggyback:250] Out: 200 [Blood:200]  BLOOD ADMINISTERED:none  DRAINS: none   LOCAL MEDICATIONS USED:  MARCAINE     SPECIMEN:  No Specimen  DISPOSITION OF SPECIMEN:  N/A  COUNTS:  YES  TOURNIQUET:  * No tourniquets in log *  DICTATION: .Note written in EPIC  PLAN OF CARE: Admit to inpatient   PATIENT DISPOSITION:  PACU - hemodynamically stable.   Delay start of Pharmacological VTE agent (>24hrs) due to surgical blood loss or risk of bleeding: yes

## 2013-11-22 NOTE — Anesthesia Preprocedure Evaluation (Addendum)
Anesthesia Evaluation  Patient identified by MRN, date of birth, ID band Patient awake    Reviewed: Allergy & Precautions, H&P , NPO status , Patient's Chart, lab work & pertinent test results  Airway Mallampati: II TM Distance: >3 FB Neck ROM: Full    Dental no notable dental hx. (+) Dental Advisory Given, Teeth Intact   Pulmonary neg pulmonary ROS, former smoker,  breath sounds clear to auscultation  Pulmonary exam normal       Cardiovascular negative cardio ROS  Rhythm:Regular Rate:Normal     Neuro/Psych negative neurological ROS  negative psych ROS   GI/Hepatic negative GI ROS, Neg liver ROS,   Endo/Other  negative endocrine ROS  Renal/GU negative Renal ROS  negative genitourinary   Musculoskeletal negative musculoskeletal ROS (+)   Abdominal   Peds negative pediatric ROS (+)  Hematology negative hematology ROS (+)   Anesthesia Other Findings   Reproductive/Obstetrics negative OB ROS                         Anesthesia Physical Anesthesia Plan  ASA: II  Anesthesia Plan: General   Post-op Pain Management:    Induction: Intravenous  Airway Management Planned: Oral ETT  Additional Equipment:   Intra-op Plan:   Post-operative Plan: Extubation in OR  Informed Consent: I have reviewed the patients History and Physical, chart, labs and discussed the procedure including the risks, benefits and alternatives for the proposed anesthesia with the patient or authorized representative who has indicated his/her understanding and acceptance.   Dental advisory given  Plan Discussed with: CRNA, Anesthesiologist and Surgeon  Anesthesia Plan Comments:         Anesthesia Quick Evaluation

## 2013-11-22 NOTE — Anesthesia Procedure Notes (Signed)
Procedure Name: Intubation Date/Time: 11/22/2013 12:35 PM Performed by: Jacob Moores Pre-anesthesia Checklist: Emergency Drugs available, Patient identified, Suction available and Patient being monitored Patient Re-evaluated:Patient Re-evaluated prior to inductionOxygen Delivery Method: Circle system utilized Preoxygenation: Pre-oxygenation with 100% oxygen Ventilation: Mask ventilation without difficulty and Oral airway inserted - appropriate to patient size Laryngoscope Size: Sabra Heck and 2 Grade View: Grade II Tube type: Oral Tube size: 7.5 mm Number of attempts: 1 Airway Equipment and Method: Stylet and Oral airway Placement Confirmation: ETT inserted through vocal cords under direct vision,  positive ETCO2 and breath sounds checked- equal and bilateral Secured at: 23 cm Tube secured with: Tape Dental Injury: Teeth and Oropharynx as per pre-operative assessment

## 2013-11-22 NOTE — Transfer of Care (Signed)
Immediate Anesthesia Transfer of Care Note  Patient: [name]  Procedure(s) Performed: Procedure(s) with comments: MICRODISCECTOMY LUMBAR LAMINECTOMY (N/A) - L4-5, L5-S1 Decompression  Patient Location: PACU  Anesthesia Type:General  Level of Consciousness: awake and alert   Airway & Oxygen Therapy: Patient Spontanous Breathing and Patient connected to nasal cannula oxygen  Post-op Assessment: Report given to PACU RN, Post -op Vital signs reviewed and stable and Patient moving all extremities X 4  Post vital signs: Reviewed and stable  Complications: No apparent anesthesia complications

## 2013-11-22 NOTE — Interval H&P Note (Signed)
History and Physical Interval Note:  11/22/2013 12:16 PM  [name]  has presented today for surgery, with the diagnosis of L4-5, L5-S1 Stenosis  The various methods of treatment have been discussed with the patient and family. After consideration of risks, benefits and other options for treatment, the patient has consented to  Procedure(s) with comments: MICRODISCECTOMY LUMBAR LAMINECTOMY (N/A) - L4-5, L5-S1 Decompression as a surgical intervention .  The patient's history has been reviewed, patient examined, no change in status, stable for surgery.  I have reviewed the patient's chart and labs.  Questions were answered to the patient's satisfaction.     [name] C

## 2013-11-22 NOTE — Discharge Instructions (Signed)
No lifting greater than 10 lbs. Avoid bending, stooping and twisting. Walk in house for first week them may start to get out slowly increasing distance up to one mile by 3 weeks post op. Keep incision dry for 3 days, may use tegaderm or similar water impervious dressing. Ice packs to back daily or as needed.

## 2013-11-23 DIAGNOSIS — M47817 Spondylosis without myelopathy or radiculopathy, lumbosacral region: Secondary | ICD-10-CM | POA: Diagnosis not present

## 2013-11-23 DIAGNOSIS — M4800 Spinal stenosis, site unspecified: Secondary | ICD-10-CM | POA: Diagnosis present

## 2013-11-23 NOTE — Evaluation (Signed)
Physical Therapy Evaluation Patient Details Name: [name] MRN: [id number] DOB: Sep 30, 1940 Today's Date: 11/23/2013   History of Present Illness  73 y.o. male s/p MICRODISCECTOMY LUMBAR LAMINECTOMY (N/A) - L4-5, L5-S1 Decompression.  Clinical Impression  Patient evaluated by Physical Therapy with no further acute PT needs identified. All education has been completed and the patient has no further questions. Ambulates generally well (per patient, much better than prior to surgery.) Stair training complete and performs all tasks assessed safely. Patient his adequate for D/c from mobility standpoint. See below for any follow-up Physial Therapy or equipment needs. PT is signing off. Thank you for this referral.     Follow Up Recommendations No PT follow up    Equipment Recommendations  3in1 (PT)    Recommendations for Other Services       Precautions / Restrictions Precautions Precautions: Back Precaution Booklet Issued: Yes (comment) Precaution Comments: Reviewed back precautions Restrictions Weight Bearing Restrictions: No      Mobility  Bed Mobility Overal bed mobility: Needs Assistance Bed Mobility: Rolling;Sidelying to Sit Rolling: Supervision Sidelying to sit: Supervision       General bed mobility comments: Supervision for safety. Educated on log roll technique to maintain minimal stress at surgical site while healing. completed this task without physical assist  Transfers Overall transfer level: Needs assistance Equipment used: None Transfers: Sit to/from Stand Sit to Stand: Supervision         General transfer comment: Supervision for safety with VC for technique and hand placement. Performed from lowest bed setting and low table mat.  Ambulation/Gait Ambulation/Gait assistance: Supervision Ambulation Distance (Feet): 300 Feet Assistive device: None Gait Pattern/deviations: Step-through pattern;Wide base of support;Decreased stride length      General Gait Details: Ambulates generally well. Educated on safe mobility techniques while ambulating. Performed challenging balance activities while ambulating i.e. head turns, various speeds, direction change, and turns. no loss of balance noted.  Stairs Stairs: Yes Stairs assistance: Min guard Stair Management: No rails Number of Stairs: 2 General stair comments: Min guard for safety. Able to safely ascend stairs with VC for technique. Requires extra time but no loss of balance.  Wheelchair Mobility    Modified Rankin (Stroke Patients Only)       Balance Overall balance assessment: Needs assistance Sitting-balance support: No upper extremity supported;Feet supported Sitting balance-Leahy Scale: Good     Standing balance support: No upper extremity supported Standing balance-Leahy Scale: Good                               Pertinent Vitals/Pain Pain Assessment: 0-10 Pain Score: 0-No pain ("just sore, no pain") Pain Location: back Pain Intervention(s): Limited activity within patient's tolerance;Monitored during session;Repositioned    Home Living Family/patient expects to be discharged to:: Private residence Living Arrangements: Spouse/significant other Available Help at Discharge: Family;Available 24 hours/day Type of Home: House Home Access: Stairs to enter Entrance Stairs-Rails: None Entrance Stairs-Number of Steps: 1 Home Layout: One level Home Equipment: None      Prior Function Level of Independence: Independent               Hand Dominance   Dominant Hand: Right    Extremity/Trunk Assessment   Upper Extremity Assessment: Defer to OT evaluation           Lower Extremity Assessment: Overall WFL for tasks assessed         Communication   Communication: No difficulties  Cognition Arousal/Alertness: Awake/alert Behavior During Therapy: WFL for tasks assessed/performed Overall Cognitive Status: Within Functional Limits for  tasks assessed                      General Comments General comments (skin integrity, edema, etc.): Discussed safe mobility following microdiscectomy. Frequent mobilization and protection of spine.    Exercises General Exercises - Lower Extremity Ankle Circles/Pumps: AROM;Both;10 reps;Seated Long Arc Quad: Strengthening;Both;10 reps;Seated      Assessment/Plan    PT Assessment Patent does not need any further PT services  PT Diagnosis     PT Problem List    PT Treatment Interventions     PT Goals (Current goals can be found in the Care Plan section) Acute Rehab PT Goals Patient Stated Goal: Go home PT Goal Formulation: No goals set, d/c therapy    Frequency     Barriers to discharge        Co-evaluation               End of Session   Activity Tolerance: Patient tolerated treatment well Patient left: in chair;with call bell/phone within reach;with family/visitor present Nurse Communication: Mobility status    Functional Assessment Tool Used: Clinical observation Functional Limitation: Mobility: Walking and moving around Mobility: Walking and Moving Around Current Status ([id number]): At least 1 percent but less than 20 percent impaired, limited or restricted Mobility: Walking and Moving Around Goal Status [phone]): At least 1 percent but less than 20 percent impaired, limited or restricted Mobility: Walking and Moving Around Discharge Status [phone]): At least 1 percent but less than 20 percent impaired, limited or restricted    Time: 0941-1008 PT Time Calculation (min): 27 min   Charges:   PT Evaluation $Initial PT Evaluation Tier I: 1 Procedure PT Treatments $Therapeutic Activity: 8-22 mins   PT G Codes:   Functional Assessment Tool Used: Clinical observation Functional Limitation: Mobility: Walking and moving around  [place], [place]   Ellouise Newer 11/23/2013, 10:32 AM

## 2013-11-23 NOTE — Plan of Care (Signed)
Problem: Consults Goal: Diagnosis - Spinal Surgery Outcome: Completed/Met Date Met:  11/23/13 Microdiscectomy L4-L5, L5-S1

## 2013-11-23 NOTE — Op Note (Unsigned)
[name], [name]              ACCOUNT NO.:  [id number]  MEDICAL RECORD NO.:  [id number]  LOCATION:  5N32C                        FACILITY:  [facility]  PHYSICIAN:  [name], M.D.    DATE OF BIRTH:  [birth year]  DATE OF PROCEDURE:  11/22/2013 DATE OF DISCHARGE:                              OPERATIVE REPORT   PREOPERATIVE DIAGNOSES:  Lumbar spondylosis with multilevel spinal stenosis, lateral recess stenosis, and central stenosis.  POSTOPERATIVE DIAGNOSES:  Lumbar spondylosis with multilevel spinal stenosis, lateral recess stenosis, and central stenosis.  PROCEDURE:  L4-L5 and L5-S1 decompression, central laminectomy, foraminotomy, and lateral recess decompression.  SURGEON:  [name], M.D.  ASSISTANT:  Dylan Hay, PA-C, medically necessary and present for the entire procedure.  ANESTHESIA:  General.  ESTIMATED BLOOD LOSS:  150 mL.  DRAINS:  None.  COMPLICATIONS:  2-mm dorsal dural tear repaired with watertight seal.  BRIEF HISTORY:  This 73 year old male had recent prostate surgery a couple months ago, had either spinal or epidural with problems with spinal headaches, severe, required extended hospital stay and then had a blood patch.  The patient had resolution of the headache and then was later discharged.  The spinal stenosis, which was present with neurogenic claudication for years, has gradually progressed.  He has been __________ better, but has pain with standing, pain with ambulation, at 100-200 feet has to stop, and got relief with leaning forward.  MRI scan showed severe stenosis, multilevel spondylosis, minimal scoliosis with lateral recess stenosis, severe at L4-L5 and L5- S1.  The patient was not having radicular symptoms, just neurogenic claudication symptoms with good arterial pulses.  PROCEDURE IN DETAIL:  After induction of general anesthesia, with the patient in prone position, pads over the anterior aspect of the shoulder ulnar nerve,  preoperative Ancef prophylaxis, back was prepped with DuraPrep.  The area was squared with towels.  Betadine and Steri-Drape applied, laminectomy sheet.  Time-out procedure completed.  Two spinal needles were placed at the level of planned decompression and were about one-half level high.  Area where the needles were placed were marked. Midline incision was made with subperiosteal dissection out to the lamina and then 2 Kocher clamps were placed with repeat cross-table lateral x-ray confirming appropriate decompression at planned levels. Posterior elements were removed at L5 and L4 and a little bit at the inferior aspect of L3, about one-half.  Lamina was thinned with a bur. There was very hypertrophic thick chunks of ligament overhanging facet spurs that almost reached the midline and hemosiderin stained areas initially were thought to be representing hemorrhagic facet cyst, but may well have been related to the blood patch procedure he had after the prostate procedure with postoperative spinal headache.  Decompression continued until there was good decompression at the end of the procedure after both gutters had been decompressed, removal of thick chunks of ligament overhanging spurs at the level of the pedicle.  With palpation of the disk, which were hard with __________ of endplate bone present, but no soft tissue disk component.  Dural tear was visualized 2 mm to the left of midline at the cephalad area of decompression.  This area had been freed up with the  hockey stick __________ been used to protecting the dura.  Dural tear was 2 mm, was longitudinal, and 4-0 Nurolon simple suture was placed side-to-side for watertight closure. Valsalva to 30 cm by the CRNA with no leakage and then some DuraBond was dripped on the top, 3 or 4 drops.  __________ was round.  There was no narrowing or kinkage.  Gutters were cleaned.  Overhanging spikes from the side were present.  __________ irrigated  with saline solution. Closure of the fascia with #1 Vicryl and #2-0 Vicryl for subcutaneous tissue subcuticular closure.  Dermabond in the skin, postoperative dressing.  The patient was transferred to the recovery room in stable condition.     [name], M.D.     MCY/MEDQ  D:  11/22/2013  T:  11/22/2013  Job:  [id number]

## 2013-11-23 NOTE — Anesthesia Postprocedure Evaluation (Signed)
  Anesthesia Post-op Note  Patient: [name]  Procedure(s) Performed: Procedure(s) (LRB): MICRODISCECTOMY LUMBAR LAMINECTOMY (N/A)  Patient Location: PACU  Anesthesia Type: General  Level of Consciousness: awake and alert   Airway and Oxygen Therapy: Patient Spontanous Breathing  Post-op Pain: mild  Post-op Assessment: Post-op Vital signs reviewed, Patient's Cardiovascular Status Stable, Respiratory Function Stable, Patent Airway and No signs of Nausea or vomiting  Last Vitals:  Filed Vitals:   11/23/13 0849  BP: 102/51  Pulse: 62  Temp:   Resp: 18    Post-op Vital Signs: stable   Complications: No apparent anesthesia complications

## 2013-11-23 NOTE — Progress Notes (Signed)
Patient discharged to home accompanied by wife. Discharge instructions and rx given and explained and patient stated understanding. IV was removed and dressing changed. Patient left unit in a stable condition with all personal belongings via wheelchair.

## 2013-11-23 NOTE — Progress Notes (Signed)
Subjective: 1 Day Post-Op Procedure(s) (LRB): MICRODISCECTOMY LUMBAR LAMINECTOMY (N/A) Patient reports pain as mild.  Incisional pain , no leg pain.   Objective: Vital signs in last 24 hours: Temp:  [97.7 F (36.5 C)-98.3 F (36.8 C)] 98.1 F (36.7 C) (08/27 0459) Pulse Rate:  [60-91] 69 (08/27 0459) Resp:  [3-20] 14 (08/27 0459) BP: (102-174)/(58-84) 131/74 mmHg (08/27 0459) SpO2:  [94 %-100 %] 100 % (08/27 0459) Weight:  [87.091 kg (192 lb)] 87.091 kg (192 lb) (08/26 1019)  Intake/Output from previous day: 08/26 0701 - 08/27 0700 In: 1830 [P.O.:180; I.V.:1400; IV Piggyback:250] Out: 1200 [Urine:1000; Blood:200] Intake/Output this shift:    No results found for this basename: HGB,  in the last 72 hours No results found for this basename: WBC, RBC, HCT, PLT,  in the last 72 hours No results found for this basename: NA, K, CL, CO2, BUN, CREATININE, GLUCOSE, CALCIUM,  in the last 72 hours No results found for this basename: LABPT, INR,  in the last 72 hours  Neurologically intact Sensation intact distally  Assessment/Plan: 1 Day Post-Op Procedure(s) (LRB): MICRODISCECTOMY LUMBAR LAMINECTOMY (N/A) Up with therapy discharge home today.   Catelyn Friel C 11/23/2013, 7:39 AM

## 2013-11-24 ENCOUNTER — Encounter (HOSPITAL_COMMUNITY): Payer: Self-pay | Admitting: Orthopaedic Surgery

## 2013-11-27 NOTE — Discharge Summary (Signed)
Physician Discharge Summary  Patient ID: LUQMAN PERRELLI MRN: [id number] DOB/AGE: Sep 16, 1940 73 y.o.  Admit date: 11/22/2013 Discharge date: 11/23/2013  Admission Diagnoses:  Degenerative lumbar spinal stenosis, central and lateral recess, at L4-5 and L5-S1  Discharge Diagnoses:  Principal Problem:   Degenerative lumbar spinal stenosis Active Problems:   Spinal stenosis   Past Medical History  Diagnosis Date  . Benign prostatic hypertrophy   . Elevated PSA   . Multiple pulmonary nodules   . Hypercholesterolemia     Surgeries: Procedure(s): MICRODISCECTOMY LUMBAR LAMINECTOMY L4-5 and L5-S1 on 11/22/2013   Consultants (if any):  none  Discharged Condition: Improved  Hospital Course: STEFEN JUBA is an 73 y.o. male who was admitted 11/22/2013 with a diagnosis of Degenerative lumbar spinal stenosis and went to the operating room on 11/22/2013 and underwent the above named procedures.    He was given perioperative antibiotics:  Anti-infectives   Start     Dose/Rate Route Frequency Ordered Stop   11/22/13 1800  ceFAZolin (ANCEF) IVPB 1 g/50 mL premix     1 g 100 mL/hr over 30 Minutes Intravenous Every 8 hours 11/22/13 1635 11/23/13 0313   11/22/13 0600  ceFAZolin (ANCEF) IVPB 2 g/50 mL premix     2 g 100 mL/hr over 30 Minutes Intravenous On call to O.R. 11/21/13 1433 11/22/13 1240    .  He was given sequential compression devices, early ambulation for DVT prophylaxis.  He benefited maximally from the hospital stay and there were no complications.    Recent vital signs:  Filed Vitals:   11/23/13 0459  BP: 131/74  Pulse: 69  Temp: 98.1 F (36.7 C)  Resp: 14    Recent laboratory studies:  Lab Results  Component Value Date   HGB 14.0 11/14/2013   Lab Results  Component Value Date   WBC 5.4 11/14/2013   PLT 177 11/14/2013   Lab Results  Component Value Date   INR 0.91 11/14/2013   Lab Results  Component Value Date   NA 139 11/14/2013   K 4.7 11/14/2013   CL 103 11/14/2013   CO2 23 11/14/2013   BUN 17 11/14/2013   CREATININE 0.79 11/14/2013   GLUCOSE 107* 11/14/2013    Discharge Medications:     Medication List         ARTHRITIS PAIN RELIEF PO  Take 2 tablets by mouth 2 (two) times daily.     CALCIUM PO  Take 1 tablet by mouth daily.     multivitamin with minerals Tabs tablet  Take 1 tablet by mouth daily.     oxyCODONE-acetaminophen 5-325 MG per tablet  Commonly known as:  ROXICET  Take 1-2 tablets by mouth every 4 (four) hours as needed.     simvastatin 40 MG tablet  Commonly known as:  ZOCOR  Take 40 mg by mouth at bedtime.        Diagnostic Studies: Dg Chest 2 View  11/14/2013   CLINICAL DATA:  Preop for lumbar spine surgery.  EXAM: CHEST  2 VIEW  COMPARISON:  07/07/2011.  FINDINGS: Cardiac silhouette is normal in size. Normal mediastinal and hilar contours.  No lung consolidation or edema. Small lung nodules seen on the prior CT are not resolved radiographically. No pleural effusion or pneumothorax.  Bony thorax is intact  IMPRESSION: No active cardiopulmonary disease.   Electronically Signed   By: Lajean Manes M.D.   On: 11/14/2013 12:09   Dg Lumbar Spine 2-3 Views  11/22/2013  CLINICAL DATA:  L4-5 and L5-S1 decompression.  EXAM: LUMBAR SPINE - 2-3 VIEW  COMPARISON:  MRI lumbar spine 09/18/2013.  FINDINGS: We are provided with 2 intraoperative views of the lumbar spine in the lateral projection. On the first image, metallic probes are at the levels of the L3-4 and L5-S1 disc interspaces. On the second image, probes are seen at the level of the L3-4 disc interspace and mid S1.  IMPRESSION: Localization as above.   Electronically Signed   By: Inge Rise M.D.   On: 11/22/2013 15:35    Disposition: 01-Home or Self Care  DISCHARGE INSTRUCTIONS:  No lifting greater than 10 lbs. Avoid bending, stooping and twisting. Walk in house for first week them may start to get out slowly increasing distance up to one mile by 3 weeks  post op. Keep incision dry for 3 days, may use tegaderm or similar water impervious dressing. Ice packs to back daily or as needed.       Follow-up Information   Follow up with Marybelle Killings, MD. Schedule an appointment as soon as possible for a visit in 2 weeks.   Specialty:  Orthopedic Surgery   Contact information:   [place] [address] [phone]        Signed: Epimenio Foot 11/27/2013, 3:49 PM

## 2013-12-22 DIAGNOSIS — L578 Other skin changes due to chronic exposure to nonionizing radiation: Secondary | ICD-10-CM | POA: Diagnosis not present

## 2013-12-22 DIAGNOSIS — Z125 Encounter for screening for malignant neoplasm of prostate: Secondary | ICD-10-CM | POA: Diagnosis not present

## 2013-12-22 DIAGNOSIS — E782 Mixed hyperlipidemia: Secondary | ICD-10-CM | POA: Diagnosis not present

## 2013-12-22 DIAGNOSIS — I10 Essential (primary) hypertension: Secondary | ICD-10-CM | POA: Diagnosis not present

## 2014-01-23 DIAGNOSIS — I10 Essential (primary) hypertension: Secondary | ICD-10-CM | POA: Diagnosis not present

## 2014-01-23 DIAGNOSIS — R1030 Lower abdominal pain, unspecified: Secondary | ICD-10-CM | POA: Diagnosis not present

## 2014-01-23 DIAGNOSIS — E782 Mixed hyperlipidemia: Secondary | ICD-10-CM | POA: Diagnosis not present

## 2014-01-25 DIAGNOSIS — N281 Cyst of kidney, acquired: Secondary | ICD-10-CM | POA: Diagnosis not present

## 2014-01-25 DIAGNOSIS — R109 Unspecified abdominal pain: Secondary | ICD-10-CM | POA: Diagnosis not present

## 2014-01-25 DIAGNOSIS — R1030 Lower abdominal pain, unspecified: Secondary | ICD-10-CM | POA: Diagnosis not present

## 2014-02-01 DIAGNOSIS — R1030 Lower abdominal pain, unspecified: Secondary | ICD-10-CM | POA: Diagnosis not present

## 2014-02-01 DIAGNOSIS — Z01812 Encounter for preprocedural laboratory examination: Secondary | ICD-10-CM | POA: Diagnosis not present

## 2014-02-02 DIAGNOSIS — E279 Disorder of adrenal gland, unspecified: Secondary | ICD-10-CM | POA: Diagnosis not present

## 2014-02-02 DIAGNOSIS — R1030 Lower abdominal pain, unspecified: Secondary | ICD-10-CM | POA: Diagnosis not present

## 2014-04-23 DIAGNOSIS — I10 Essential (primary) hypertension: Secondary | ICD-10-CM | POA: Diagnosis not present

## 2014-04-23 DIAGNOSIS — J018 Other acute sinusitis: Secondary | ICD-10-CM | POA: Diagnosis not present

## 2014-04-23 DIAGNOSIS — E782 Mixed hyperlipidemia: Secondary | ICD-10-CM | POA: Diagnosis not present

## 2014-05-15 DIAGNOSIS — R310 Gross hematuria: Secondary | ICD-10-CM | POA: Diagnosis not present

## 2014-05-17 DIAGNOSIS — R350 Frequency of micturition: Secondary | ICD-10-CM | POA: Diagnosis not present

## 2014-05-17 DIAGNOSIS — R310 Gross hematuria: Secondary | ICD-10-CM | POA: Diagnosis not present

## 2014-05-22 DIAGNOSIS — D3501 Benign neoplasm of right adrenal gland: Secondary | ICD-10-CM | POA: Diagnosis not present

## 2014-05-22 DIAGNOSIS — N281 Cyst of kidney, acquired: Secondary | ICD-10-CM | POA: Diagnosis not present

## 2014-05-22 DIAGNOSIS — D3502 Benign neoplasm of left adrenal gland: Secondary | ICD-10-CM | POA: Diagnosis not present

## 2014-05-22 DIAGNOSIS — R310 Gross hematuria: Secondary | ICD-10-CM | POA: Diagnosis not present

## 2014-05-29 DIAGNOSIS — R310 Gross hematuria: Secondary | ICD-10-CM | POA: Diagnosis not present

## 2014-05-29 DIAGNOSIS — R350 Frequency of micturition: Secondary | ICD-10-CM | POA: Diagnosis not present

## 2014-06-04 DIAGNOSIS — R319 Hematuria, unspecified: Secondary | ICD-10-CM | POA: Diagnosis not present

## 2014-07-25 DIAGNOSIS — I10 Essential (primary) hypertension: Secondary | ICD-10-CM | POA: Diagnosis not present

## 2014-08-01 DIAGNOSIS — E782 Mixed hyperlipidemia: Secondary | ICD-10-CM | POA: Diagnosis not present

## 2014-08-01 DIAGNOSIS — I10 Essential (primary) hypertension: Secondary | ICD-10-CM | POA: Diagnosis not present

## 2014-09-18 DIAGNOSIS — H18892 Other specified disorders of cornea, left eye: Secondary | ICD-10-CM | POA: Diagnosis not present

## 2014-10-01 HISTORY — PX: OTHER SURGICAL HISTORY: SHX169

## 2014-10-03 DIAGNOSIS — Z7982 Long term (current) use of aspirin: Secondary | ICD-10-CM | POA: Diagnosis not present

## 2014-10-03 DIAGNOSIS — S90562A Insect bite (nonvenomous), left ankle, initial encounter: Secondary | ICD-10-CM | POA: Diagnosis not present

## 2014-10-03 DIAGNOSIS — Z79899 Other long term (current) drug therapy: Secondary | ICD-10-CM | POA: Diagnosis not present

## 2014-10-03 DIAGNOSIS — W57XXXA Bitten or stung by nonvenomous insect and other nonvenomous arthropods, initial encounter: Secondary | ICD-10-CM | POA: Diagnosis not present

## 2014-10-03 DIAGNOSIS — R110 Nausea: Secondary | ICD-10-CM | POA: Diagnosis not present

## 2014-10-22 DIAGNOSIS — Z1389 Encounter for screening for other disorder: Secondary | ICD-10-CM | POA: Diagnosis not present

## 2014-10-22 DIAGNOSIS — Z0000 Encounter for general adult medical examination without abnormal findings: Secondary | ICD-10-CM | POA: Diagnosis not present

## 2014-10-22 DIAGNOSIS — I10 Essential (primary) hypertension: Secondary | ICD-10-CM | POA: Diagnosis not present

## 2014-12-04 DIAGNOSIS — N99111 Postprocedural bulbous urethral stricture: Secondary | ICD-10-CM | POA: Diagnosis not present

## 2014-12-04 DIAGNOSIS — R310 Gross hematuria: Secondary | ICD-10-CM | POA: Diagnosis not present

## 2015-01-21 DIAGNOSIS — I10 Essential (primary) hypertension: Secondary | ICD-10-CM | POA: Diagnosis not present

## 2015-01-21 DIAGNOSIS — M545 Low back pain: Secondary | ICD-10-CM | POA: Diagnosis not present

## 2015-04-22 DIAGNOSIS — I10 Essential (primary) hypertension: Secondary | ICD-10-CM | POA: Diagnosis not present

## 2015-04-22 DIAGNOSIS — M1712 Unilateral primary osteoarthritis, left knee: Secondary | ICD-10-CM | POA: Diagnosis not present

## 2015-04-22 DIAGNOSIS — Z6827 Body mass index (BMI) 27.0-27.9, adult: Secondary | ICD-10-CM | POA: Diagnosis not present

## 2015-10-01 DIAGNOSIS — S61210A Laceration without foreign body of right index finger without damage to nail, initial encounter: Secondary | ICD-10-CM | POA: Diagnosis not present

## 2015-10-01 DIAGNOSIS — Z7982 Long term (current) use of aspirin: Secondary | ICD-10-CM | POA: Diagnosis not present

## 2015-10-01 DIAGNOSIS — E785 Hyperlipidemia, unspecified: Secondary | ICD-10-CM | POA: Diagnosis not present

## 2015-10-01 DIAGNOSIS — X58XXXA Exposure to other specified factors, initial encounter: Secondary | ICD-10-CM | POA: Diagnosis not present

## 2015-10-01 DIAGNOSIS — Z79899 Other long term (current) drug therapy: Secondary | ICD-10-CM | POA: Diagnosis not present

## 2015-10-01 DIAGNOSIS — Z23 Encounter for immunization: Secondary | ICD-10-CM | POA: Diagnosis not present

## 2015-10-03 DIAGNOSIS — S61411D Laceration without foreign body of right hand, subsequent encounter: Secondary | ICD-10-CM | POA: Diagnosis not present

## 2015-10-03 DIAGNOSIS — S61210D Laceration without foreign body of right index finger without damage to nail, subsequent encounter: Secondary | ICD-10-CM | POA: Diagnosis not present

## 2015-10-03 DIAGNOSIS — Z4800 Encounter for change or removal of nonsurgical wound dressing: Secondary | ICD-10-CM | POA: Diagnosis not present

## 2015-10-08 DIAGNOSIS — S6440XA Injury of digital nerve of unspecified finger, initial encounter: Secondary | ICD-10-CM | POA: Diagnosis not present

## 2015-10-08 DIAGNOSIS — S61210A Laceration without foreign body of right index finger without damage to nail, initial encounter: Secondary | ICD-10-CM | POA: Diagnosis not present

## 2015-10-31 DIAGNOSIS — I10 Essential (primary) hypertension: Secondary | ICD-10-CM | POA: Diagnosis not present

## 2015-10-31 DIAGNOSIS — M1712 Unilateral primary osteoarthritis, left knee: Secondary | ICD-10-CM | POA: Diagnosis not present

## 2015-11-07 DIAGNOSIS — M1712 Unilateral primary osteoarthritis, left knee: Secondary | ICD-10-CM | POA: Diagnosis not present

## 2015-11-07 DIAGNOSIS — I10 Essential (primary) hypertension: Secondary | ICD-10-CM | POA: Diagnosis not present

## 2015-11-13 DIAGNOSIS — Z125 Encounter for screening for malignant neoplasm of prostate: Secondary | ICD-10-CM | POA: Diagnosis not present

## 2015-11-13 DIAGNOSIS — I10 Essential (primary) hypertension: Secondary | ICD-10-CM | POA: Diagnosis not present

## 2015-12-09 DIAGNOSIS — R310 Gross hematuria: Secondary | ICD-10-CM | POA: Diagnosis not present

## 2016-01-31 DIAGNOSIS — M545 Low back pain: Secondary | ICD-10-CM | POA: Diagnosis not present

## 2016-01-31 DIAGNOSIS — I10 Essential (primary) hypertension: Secondary | ICD-10-CM | POA: Diagnosis not present

## 2016-02-19 DIAGNOSIS — I10 Essential (primary) hypertension: Secondary | ICD-10-CM | POA: Diagnosis not present

## 2016-02-19 DIAGNOSIS — J3089 Other allergic rhinitis: Secondary | ICD-10-CM | POA: Diagnosis not present

## 2016-02-19 DIAGNOSIS — E784 Other hyperlipidemia: Secondary | ICD-10-CM | POA: Diagnosis not present

## 2016-03-06 DIAGNOSIS — M1712 Unilateral primary osteoarthritis, left knee: Secondary | ICD-10-CM | POA: Diagnosis not present

## 2016-03-06 DIAGNOSIS — I10 Essential (primary) hypertension: Secondary | ICD-10-CM | POA: Diagnosis not present

## 2016-03-06 DIAGNOSIS — M545 Low back pain: Secondary | ICD-10-CM | POA: Diagnosis not present

## 2016-03-06 DIAGNOSIS — E784 Other hyperlipidemia: Secondary | ICD-10-CM | POA: Diagnosis not present

## 2016-03-29 DIAGNOSIS — J40 Bronchitis, not specified as acute or chronic: Secondary | ICD-10-CM | POA: Diagnosis not present

## 2016-04-16 DIAGNOSIS — I10 Essential (primary) hypertension: Secondary | ICD-10-CM | POA: Diagnosis not present

## 2016-04-16 DIAGNOSIS — M545 Low back pain: Secondary | ICD-10-CM | POA: Diagnosis not present

## 2016-04-16 DIAGNOSIS — M1712 Unilateral primary osteoarthritis, left knee: Secondary | ICD-10-CM | POA: Diagnosis not present

## 2016-04-16 DIAGNOSIS — E784 Other hyperlipidemia: Secondary | ICD-10-CM | POA: Diagnosis not present

## 2016-05-11 DIAGNOSIS — I10 Essential (primary) hypertension: Secondary | ICD-10-CM | POA: Diagnosis not present

## 2016-05-11 DIAGNOSIS — M545 Low back pain: Secondary | ICD-10-CM | POA: Diagnosis not present

## 2016-05-11 DIAGNOSIS — M1712 Unilateral primary osteoarthritis, left knee: Secondary | ICD-10-CM | POA: Diagnosis not present

## 2016-05-11 DIAGNOSIS — E784 Other hyperlipidemia: Secondary | ICD-10-CM | POA: Diagnosis not present

## 2016-06-02 DIAGNOSIS — M1712 Unilateral primary osteoarthritis, left knee: Secondary | ICD-10-CM | POA: Diagnosis not present

## 2016-06-02 DIAGNOSIS — M545 Low back pain: Secondary | ICD-10-CM | POA: Diagnosis not present

## 2016-06-02 DIAGNOSIS — E784 Other hyperlipidemia: Secondary | ICD-10-CM | POA: Diagnosis not present

## 2016-06-02 DIAGNOSIS — I10 Essential (primary) hypertension: Secondary | ICD-10-CM | POA: Diagnosis not present

## 2016-06-12 DIAGNOSIS — I10 Essential (primary) hypertension: Secondary | ICD-10-CM | POA: Diagnosis not present

## 2016-06-12 DIAGNOSIS — Z1389 Encounter for screening for other disorder: Secondary | ICD-10-CM | POA: Diagnosis not present

## 2016-06-12 DIAGNOSIS — E784 Other hyperlipidemia: Secondary | ICD-10-CM | POA: Diagnosis not present

## 2016-06-12 DIAGNOSIS — Z0000 Encounter for general adult medical examination without abnormal findings: Secondary | ICD-10-CM | POA: Diagnosis not present

## 2016-07-06 DIAGNOSIS — M545 Low back pain: Secondary | ICD-10-CM | POA: Diagnosis not present

## 2016-07-06 DIAGNOSIS — I10 Essential (primary) hypertension: Secondary | ICD-10-CM | POA: Diagnosis not present

## 2016-07-06 DIAGNOSIS — E784 Other hyperlipidemia: Secondary | ICD-10-CM | POA: Diagnosis not present

## 2016-07-06 DIAGNOSIS — M1712 Unilateral primary osteoarthritis, left knee: Secondary | ICD-10-CM | POA: Diagnosis not present

## 2016-07-28 DIAGNOSIS — I10 Essential (primary) hypertension: Secondary | ICD-10-CM | POA: Diagnosis not present

## 2016-07-28 DIAGNOSIS — E784 Other hyperlipidemia: Secondary | ICD-10-CM | POA: Diagnosis not present

## 2016-07-28 DIAGNOSIS — M1712 Unilateral primary osteoarthritis, left knee: Secondary | ICD-10-CM | POA: Diagnosis not present

## 2016-07-28 DIAGNOSIS — M545 Low back pain: Secondary | ICD-10-CM | POA: Diagnosis not present

## 2016-09-11 DIAGNOSIS — M5431 Sciatica, right side: Secondary | ICD-10-CM | POA: Diagnosis not present

## 2016-09-11 DIAGNOSIS — Z0000 Encounter for general adult medical examination without abnormal findings: Secondary | ICD-10-CM | POA: Diagnosis not present

## 2016-09-11 DIAGNOSIS — E784 Other hyperlipidemia: Secondary | ICD-10-CM | POA: Diagnosis not present

## 2016-09-11 DIAGNOSIS — I10 Essential (primary) hypertension: Secondary | ICD-10-CM | POA: Diagnosis not present

## 2016-10-02 DIAGNOSIS — M1712 Unilateral primary osteoarthritis, left knee: Secondary | ICD-10-CM | POA: Diagnosis not present

## 2016-10-02 DIAGNOSIS — I10 Essential (primary) hypertension: Secondary | ICD-10-CM | POA: Diagnosis not present

## 2016-10-02 DIAGNOSIS — M545 Low back pain: Secondary | ICD-10-CM | POA: Diagnosis not present

## 2016-10-02 DIAGNOSIS — E784 Other hyperlipidemia: Secondary | ICD-10-CM | POA: Diagnosis not present

## 2016-10-20 DIAGNOSIS — H43812 Vitreous degeneration, left eye: Secondary | ICD-10-CM | POA: Diagnosis not present

## 2016-10-28 DIAGNOSIS — H60393 Other infective otitis externa, bilateral: Secondary | ICD-10-CM | POA: Diagnosis not present

## 2016-11-16 DIAGNOSIS — T1501XA Foreign body in cornea, right eye, initial encounter: Secondary | ICD-10-CM | POA: Diagnosis not present

## 2016-11-23 DIAGNOSIS — H11003 Unspecified pterygium of eye, bilateral: Secondary | ICD-10-CM | POA: Diagnosis not present

## 2016-12-14 DIAGNOSIS — M1712 Unilateral primary osteoarthritis, left knee: Secondary | ICD-10-CM | POA: Diagnosis not present

## 2016-12-14 DIAGNOSIS — I10 Essential (primary) hypertension: Secondary | ICD-10-CM | POA: Diagnosis not present

## 2016-12-14 DIAGNOSIS — E784 Other hyperlipidemia: Secondary | ICD-10-CM | POA: Diagnosis not present

## 2016-12-14 DIAGNOSIS — M545 Low back pain: Secondary | ICD-10-CM | POA: Diagnosis not present

## 2016-12-24 DIAGNOSIS — I10 Essential (primary) hypertension: Secondary | ICD-10-CM | POA: Diagnosis not present

## 2016-12-24 DIAGNOSIS — J3089 Other allergic rhinitis: Secondary | ICD-10-CM | POA: Diagnosis not present

## 2016-12-24 DIAGNOSIS — E784 Other hyperlipidemia: Secondary | ICD-10-CM | POA: Diagnosis not present

## 2017-02-10 DIAGNOSIS — I10 Essential (primary) hypertension: Secondary | ICD-10-CM | POA: Diagnosis not present

## 2017-02-10 DIAGNOSIS — E7849 Other hyperlipidemia: Secondary | ICD-10-CM | POA: Diagnosis not present

## 2017-03-19 DIAGNOSIS — E7849 Other hyperlipidemia: Secondary | ICD-10-CM | POA: Diagnosis not present

## 2017-03-19 DIAGNOSIS — I10 Essential (primary) hypertension: Secondary | ICD-10-CM | POA: Diagnosis not present

## 2017-03-25 DIAGNOSIS — I10 Essential (primary) hypertension: Secondary | ICD-10-CM | POA: Diagnosis not present

## 2017-03-25 DIAGNOSIS — J3089 Other allergic rhinitis: Secondary | ICD-10-CM | POA: Diagnosis not present

## 2017-03-25 DIAGNOSIS — E7849 Other hyperlipidemia: Secondary | ICD-10-CM | POA: Diagnosis not present

## 2017-04-01 DIAGNOSIS — E7849 Other hyperlipidemia: Secondary | ICD-10-CM | POA: Diagnosis not present

## 2017-04-01 DIAGNOSIS — J3089 Other allergic rhinitis: Secondary | ICD-10-CM | POA: Diagnosis not present

## 2017-04-01 DIAGNOSIS — I10 Essential (primary) hypertension: Secondary | ICD-10-CM | POA: Diagnosis not present

## 2017-04-02 DIAGNOSIS — M6281 Muscle weakness (generalized): Secondary | ICD-10-CM | POA: Diagnosis not present

## 2017-04-02 DIAGNOSIS — G8929 Other chronic pain: Secondary | ICD-10-CM | POA: Diagnosis not present

## 2017-04-02 DIAGNOSIS — M1712 Unilateral primary osteoarthritis, left knee: Secondary | ICD-10-CM | POA: Diagnosis not present

## 2017-04-02 DIAGNOSIS — M2352 Chronic instability of knee, left knee: Secondary | ICD-10-CM | POA: Diagnosis not present

## 2017-04-27 DIAGNOSIS — Z9842 Cataract extraction status, left eye: Secondary | ICD-10-CM | POA: Diagnosis not present

## 2017-04-27 DIAGNOSIS — Z961 Presence of intraocular lens: Secondary | ICD-10-CM | POA: Diagnosis not present

## 2017-04-27 DIAGNOSIS — H11003 Unspecified pterygium of eye, bilateral: Secondary | ICD-10-CM | POA: Diagnosis not present

## 2017-04-27 DIAGNOSIS — H26491 Other secondary cataract, right eye: Secondary | ICD-10-CM | POA: Diagnosis not present

## 2017-05-18 DIAGNOSIS — E7849 Other hyperlipidemia: Secondary | ICD-10-CM | POA: Diagnosis not present

## 2017-05-18 DIAGNOSIS — I10 Essential (primary) hypertension: Secondary | ICD-10-CM | POA: Diagnosis not present

## 2017-05-25 DIAGNOSIS — J209 Acute bronchitis, unspecified: Secondary | ICD-10-CM | POA: Diagnosis not present

## 2017-05-28 DIAGNOSIS — I10 Essential (primary) hypertension: Secondary | ICD-10-CM | POA: Diagnosis not present

## 2017-05-28 DIAGNOSIS — E7849 Other hyperlipidemia: Secondary | ICD-10-CM | POA: Diagnosis not present

## 2017-06-23 DIAGNOSIS — E7849 Other hyperlipidemia: Secondary | ICD-10-CM | POA: Diagnosis not present

## 2017-06-23 DIAGNOSIS — I10 Essential (primary) hypertension: Secondary | ICD-10-CM | POA: Diagnosis not present

## 2017-06-23 DIAGNOSIS — J3089 Other allergic rhinitis: Secondary | ICD-10-CM | POA: Diagnosis not present

## 2017-06-23 DIAGNOSIS — I201 Angina pectoris with documented spasm: Secondary | ICD-10-CM | POA: Diagnosis not present

## 2017-06-23 DIAGNOSIS — J301 Allergic rhinitis due to pollen: Secondary | ICD-10-CM | POA: Diagnosis not present

## 2017-06-28 DIAGNOSIS — I201 Angina pectoris with documented spasm: Secondary | ICD-10-CM | POA: Diagnosis not present

## 2017-07-01 DIAGNOSIS — I201 Angina pectoris with documented spasm: Secondary | ICD-10-CM | POA: Diagnosis not present

## 2017-07-15 DIAGNOSIS — I10 Essential (primary) hypertension: Secondary | ICD-10-CM | POA: Diagnosis not present

## 2017-07-15 DIAGNOSIS — E7849 Other hyperlipidemia: Secondary | ICD-10-CM | POA: Diagnosis not present

## 2017-08-08 ENCOUNTER — Encounter (HOSPITAL_COMMUNITY): Payer: Self-pay | Admitting: Emergency Medicine

## 2017-08-08 ENCOUNTER — Emergency Department (HOSPITAL_COMMUNITY)
Admission: EM | Admit: 2017-08-08 | Discharge: 2017-08-08 | Disposition: A | Discharge status: Home / Self Care | Payer: Medicare Other | Attending: Emergency Medicine | Admitting: Emergency Medicine

## 2017-08-08 ENCOUNTER — Other Ambulatory Visit: Payer: Self-pay

## 2017-08-08 ENCOUNTER — Emergency Department (HOSPITAL_COMMUNITY): Payer: Medicare Other

## 2017-08-08 DIAGNOSIS — J339 Nasal polyp, unspecified: Secondary | ICD-10-CM | POA: Diagnosis not present

## 2017-08-08 DIAGNOSIS — R220 Localized swelling, mass and lump, head: Secondary | ICD-10-CM | POA: Diagnosis not present

## 2017-08-08 DIAGNOSIS — Z87891 Personal history of nicotine dependence: Secondary | ICD-10-CM | POA: Insufficient documentation

## 2017-08-08 DIAGNOSIS — R221 Localized swelling, mass and lump, neck: Secondary | ICD-10-CM | POA: Diagnosis not present

## 2017-08-08 NOTE — Discharge Instructions (Signed)
Schedule to see the ENT surgeon for evaluation/removal of nasal mass.  Let them know you had a ct scan done today

## 2017-08-08 NOTE — ED Triage Notes (Signed)
Pt has growth inside nose that appeared yesterday. Pt states unknown if anything (tick) went into nose. Pt states mowed grass Thursday, but growth appeared Saturday afternoon.

## 2017-08-08 NOTE — ED Notes (Signed)
Pt noticed growth/?FB in nose this am

## 2017-08-08 NOTE — ED Provider Notes (Signed)
[facility] EMERGENCY DEPARTMENT Provider Note   CSN: [id number] Arrival date & time: 08/08/17  1626     History   Chief Complaint Chief Complaint  Patient presents with  . Foreign Body in [place] is a 77 y.o. male.  The history is provided by the patient. No language interpreter was used.  Foreign Body in [place]  This is a new problem. The current episode started yesterday. The problem occurs constantly. The problem has not changed since onset.Pertinent negatives include no headaches. Nothing aggravates the symptoms. Nothing relieves the symptoms. He has tried nothing for the symptoms. The treatment provided no relief.  Pt thinks he has a tick in his nose.  Pt reports he noticed something hanging out of his nose yesterday   Past Medical History:  Diagnosis Date  . Benign prostatic hypertrophy   . Elevated PSA   . Hypercholesterolemia   . Multiple pulmonary nodules     Patient Active Problem List   Diagnosis Date Noted  . Spinal stenosis 11/23/2013  . Degenerative lumbar spinal stenosis 11/22/2013  . Multiple pulmonary nodules   . Hypercholesterolemia   . Elevated PSA   . Benign prostatic hypertrophy     Past Surgical History:  Procedure Laterality Date  . APPENDECTOMY    . Appendectomy in 1972.    Marland Kitchen JOINT REPLACEMENT     scope  . LUMBAR LAMINECTOMY N/A 11/22/2013   Procedure: MICRODISCECTOMY LUMBAR LAMINECTOMY;  Surgeon: Marybelle Killings, MD;  Location: [facility];  Service: Orthopedics;  Laterality: N/A;  L4-5, L5-S1 Decompression  . Scrotal cyst removed in 2002.    . TONSILLECTOMY    . Vasectomy in 1977.          Home Medications    Prior to Admission medications   Medication Sig Start Date End Date Taking? Authorizing Provider  Acetaminophen (ARTHRITIS PAIN RELIEF PO) Take 2 tablets by mouth 2 (two) times daily.    [provider]  CALCIUM PO Take 1 tablet by mouth daily.    [provider]  Multiple Vitamin (MULTIVITAMIN  WITH MINERALS) TABS tablet Take 1 tablet by mouth daily.    [provider]  oxyCODONE-acetaminophen (ROXICET) 5-325 MG per tablet Take 1-2 tablets by mouth every 4 (four) hours as needed. 11/22/13   Phillips Hay, PA-C  simvastatin (ZOCOR) 40 MG tablet Take 40 mg by mouth at bedtime.      [provider]    Family History History reviewed. No pertinent family history.  Social History Social History   Tobacco Use  . Smoking status: Former Smoker    Types: Cigarettes    Last attempt to quit: 03/30/1982    Years since quitting: 35.3  Substance Use Topics  . Alcohol use: No  . Drug use: No     Allergies   Patient has no known allergies.   Review of Systems Review of Systems  Neurological: Negative for headaches.  All other systems reviewed and are negative.    Physical Exam Updated Vital Signs BP 137/67 (BP Location: Right Arm)   Pulse 67   Temp 98.4 F (36.9 C) (Temporal)   Resp 18   Ht 5\' 11"  (1.803 m)   Wt 78 kg (172 lb)   SpO2 98%   BMI 23.99 kg/m   Physical Exam  Constitutional: He is oriented to person, place, and time. He appears well-developed and well-nourished.  HENT:  Head: Normocephalic.  Large fleshy appearing mass hanging from  nose.  I am unable to see base.  (not a tick)   Eyes: Pupils are equal, round, and reactive to light.  Neck: Normal range of motion.  Cardiovascular: Normal rate.  Pulmonary/Chest: Effort normal.  Neurological: He is alert and oriented to person, place, and time.  Skin: Skin is warm.  Psychiatric: He has a normal mood and affect.  Nursing note and vitals reviewed.    ED Treatments / Results  Labs (all labs ordered are listed, but only abnormal results are displayed) Labs Reviewed - No data to display  EKG None  Radiology Ct Head Wo Contrast  Result Date: 08/08/2017 CLINICAL DATA:  77 year old male with newly discovered mass / growth inside of nose. 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 EXAM: CT HEAD  WITHOUT CONTRAST CT MAXILLOFACIAL WITHOUT CONTRAST TECHNIQUE: Multidetector CT imaging of the head and maxillofacial structures were performed using the standard protocol without intravenous contrast. Multiplanar CT image reconstructions of the maxillofacial structures were also generated. COMPARISON:  Orbit radiographs 02/27/2010. FINDINGS: CT HEAD FINDINGS Brain: Cerebral volume is within normal limits for age. No midline shift, ventriculomegaly, mass effect, evidence of mass lesion, intracranial hemorrhage or evidence of cortically based acute infarction. Gray-white matter differentiation is within normal limits throughout the brain. Vascular: Calcified atherosclerosis at the skull base. Skull: No acute osseous abnormality identified. Other: No acute scalp soft tissue findings. CT MAXILLOFACIAL FINDINGS Sinuses: There is mild to moderate scattered bilateral paranasal sinus mucosal thickening. This is most pronounced in the right frontoethmoidal recess, anterior ethmoids and right maxillary alveolar recess. There is some superimposed bubbly opacity in the right maxillary sinus. Occupying the right nasal cavity there is a large oval mildly lobulated mass encompassing 40 x 10 x 24 millimeters (AP by transverse by CC) which has a convex protrusion into the right piriform aperture as seen on series 6, image 47. There are no destructive changes to the adjacent nasal septum or inferior turbinate, but on coronal images the lesion appears to involve and possibly extend through the right maxillary antrum (coronal image 31). Associated opacification of the right nasal cavity middle meatus. No other polypoid lesions identified in the nasal cavity, and only occasional polypoid areas of mucosal thickening in the sinuses. The bilateral tympanic cavities and mastoids are well pneumatized. There is asymmetric sclerosis of the right mastoid air cells. Osseous: Mandible intact with chronic bilateral TMJ degeneration. Scattered  dental caries. No facial bone fracture. Central skull base intact. Visible cervical vertebrae intact. Moderate right C2-C3 facet degeneration and partially visible advanced C3-C4 disc and endplate degeneration. Orbits: Intact orbital walls. Postoperative changes to both globes but otherwise normal orbits soft tissues. Soft tissues: Negative visible noncontrast pharynx, parapharyngeal spaces, retropharyngeal space, sublingual space, submandibular spaces, parotid spaces and masticator spaces. Mild postinflammatory dystrophic calcifications at the left lingual tonsil. Visible upper cervical lymph nodes are within normal limits. IMPRESSION: 1. Large lobulated 4 x 1 x 2.4 cm polypoid mass with epicenter in the right nasal cavity. No associated septal, turbinate, or bony destruction identified. The lesion may involve and perhaps partially extend into the right maxillary antrum. Top differential considerations are papilloma, antrochoanal or inflammatory polyp. Recommend ENT follow-up. 2. Associated obstructive pattern right paranasal sinus disease. 3.  Normal for age non contrast CT appearance of the brain. Electronically Signed   By: Genevie Ann M.D.   On: 08/08/2017 18:07   Ct Maxillofacial Wo Contrast  Result Date: 08/08/2017 CLINICAL DATA:  77 year old male with newly discovered mass / growth inside of  nose. 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 EXAM: CT HEAD WITHOUT CONTRAST CT MAXILLOFACIAL WITHOUT CONTRAST TECHNIQUE: Multidetector CT imaging of the head and maxillofacial structures were performed using the standard protocol without intravenous contrast. Multiplanar CT image reconstructions of the maxillofacial structures were also generated. COMPARISON:  Orbit radiographs 02/27/2010. FINDINGS: CT HEAD FINDINGS Brain: Cerebral volume is within normal limits for age. No midline shift, ventriculomegaly, mass effect, evidence of mass lesion, intracranial hemorrhage or evidence of cortically based acute infarction. Gray-white  matter differentiation is within normal limits throughout the brain. Vascular: Calcified atherosclerosis at the skull base. Skull: No acute osseous abnormality identified. Other: No acute scalp soft tissue findings. CT MAXILLOFACIAL FINDINGS Sinuses: There is mild to moderate scattered bilateral paranasal sinus mucosal thickening. This is most pronounced in the right frontoethmoidal recess, anterior ethmoids and right maxillary alveolar recess. There is some superimposed bubbly opacity in the right maxillary sinus. Occupying the right nasal cavity there is a large oval mildly lobulated mass encompassing 40 x 10 x 24 millimeters (AP by transverse by CC) which has a convex protrusion into the right piriform aperture as seen on series 6, image 47. There are no destructive changes to the adjacent nasal septum or inferior turbinate, but on coronal images the lesion appears to involve and possibly extend through the right maxillary antrum (coronal image 31). Associated opacification of the right nasal cavity middle meatus. No other polypoid lesions identified in the nasal cavity, and only occasional polypoid areas of mucosal thickening in the sinuses. The bilateral tympanic cavities and mastoids are well pneumatized. There is asymmetric sclerosis of the right mastoid air cells. Osseous: Mandible intact with chronic bilateral TMJ degeneration. Scattered dental caries. No facial bone fracture. Central skull base intact. Visible cervical vertebrae intact. Moderate right C2-C3 facet degeneration and partially visible advanced C3-C4 disc and endplate degeneration. Orbits: Intact orbital walls. Postoperative changes to both globes but otherwise normal orbits soft tissues. Soft tissues: Negative visible noncontrast pharynx, parapharyngeal spaces, retropharyngeal space, sublingual space, submandibular spaces, parotid spaces and masticator spaces. Mild postinflammatory dystrophic calcifications at the left lingual tonsil. Visible  upper cervical lymph nodes are within normal limits. IMPRESSION: 1. Large lobulated 4 x 1 x 2.4 cm polypoid mass with epicenter in the right nasal cavity. No associated septal, turbinate, or bony destruction identified. The lesion may involve and perhaps partially extend into the right maxillary antrum. Top differential considerations are papilloma, antrochoanal or inflammatory polyp. Recommend ENT follow-up. 2. Associated obstructive pattern right paranasal sinus disease. 3.  Normal for age non contrast CT appearance of the brain. Electronically Signed   By: Genevie Ann M.D.   On: 08/08/2017 18:07    Procedures Procedures (including critical care time)  Medications Ordered in ED Medications - No data to display   Initial Impression / Assessment and Plan / ED Course  I have reviewed the triage vital signs and the nursing notes.  Pertinent labs & imaging results that were available during my care of the patient were reviewed by me and considered in my medical decision making (see chart for details).     MDM  Ct scan obtained,  Ct reports large nasal mass that extends into right maxillary sinus.   I counseled pt on results.   Pt is advised he needs to see ENT for removal.     Final Clinical Impressions(s) / ED Diagnoses   Final diagnoses:  Nasal polyp    ED Discharge Orders    None    An After  Visit Summary was printed and given to the patient.    Fransico Meadow, [place] 08/08/17 Mauro Kaufmann    Pattricia Boss, MD 08/08/17 2250

## 2017-08-11 DIAGNOSIS — J0101 Acute recurrent maxillary sinusitis: Secondary | ICD-10-CM | POA: Diagnosis not present

## 2017-08-11 DIAGNOSIS — J338 Other polyp of sinus: Secondary | ICD-10-CM | POA: Diagnosis not present

## 2017-08-12 ENCOUNTER — Other Ambulatory Visit: Payer: Self-pay

## 2017-08-13 ENCOUNTER — Other Ambulatory Visit: Payer: Self-pay

## 2017-08-27 ENCOUNTER — Other Ambulatory Visit: Payer: Self-pay | Admitting: *Deleted

## 2017-08-27 NOTE — Patient Outreach (Signed)
[facility]) Care Management  08/27/2017  [name] 14-Oct-1940 [id number]  Referral via [place] ED Census: Per chart review-ED visit 08/12/2017  Telephone call to patient who was advised of reason for call & [facility] care management services. HIPPA verification received from patient.  Patient verifies that he had recent ED visit on Mother's day-5/12. States he had growth in nose that protruded from nostril that he had never seen before.  States he was seen by MD and had CT scan done. Voices that he was diagnosed with nasal polyp and was referred to ENT specialist. States he was seen by specialist 3 days after ED visit. States he is currently on antibiotics & steroids and has another appointment with ENT 6/3. Voices that he does have primary care provider who he has been in contact with.  Patient voices he is taking medications as prescribed and has no problems getting prescriptions filled. States he has no problems with transportation and has support from spouse.   Voices his health concerns are being taken care off and he has no needs to [facility] services at this time.   Plan: Case closure.   Sherrin Daisy, RN BSN [place] Management Coordinator [facility] Care Management  [phone]

## 2017-08-30 ENCOUNTER — Other Ambulatory Visit (INDEPENDENT_AMBULATORY_CARE_PROVIDER_SITE_OTHER): Payer: Self-pay | Admitting: Otolaryngology

## 2017-08-30 ENCOUNTER — Ambulatory Visit (INDEPENDENT_AMBULATORY_CARE_PROVIDER_SITE_OTHER): Payer: Medicare Other | Admitting: Otolaryngology

## 2017-08-30 DIAGNOSIS — J320 Chronic maxillary sinusitis: Secondary | ICD-10-CM

## 2017-08-30 DIAGNOSIS — J322 Chronic ethmoidal sinusitis: Secondary | ICD-10-CM

## 2017-08-30 DIAGNOSIS — J338 Other polyp of sinus: Secondary | ICD-10-CM

## 2017-09-02 DIAGNOSIS — J3089 Other allergic rhinitis: Secondary | ICD-10-CM | POA: Diagnosis not present

## 2017-09-02 DIAGNOSIS — E7849 Other hyperlipidemia: Secondary | ICD-10-CM | POA: Diagnosis not present

## 2017-09-02 DIAGNOSIS — I10 Essential (primary) hypertension: Secondary | ICD-10-CM | POA: Diagnosis not present

## 2017-09-14 ENCOUNTER — Ambulatory Visit (HOSPITAL_COMMUNITY)
Admission: RE | Admit: 2017-09-14 | Discharge: 2017-09-14 | Disposition: A | Discharge status: Home / Self Care | Payer: Medicare Other | Source: Ambulatory Visit | Attending: Otolaryngology | Admitting: Otolaryngology

## 2017-09-14 DIAGNOSIS — J329 Chronic sinusitis, unspecified: Secondary | ICD-10-CM | POA: Insufficient documentation

## 2017-09-14 DIAGNOSIS — J339 Nasal polyp, unspecified: Secondary | ICD-10-CM | POA: Insufficient documentation

## 2017-09-14 DIAGNOSIS — J3489 Other specified disorders of nose and nasal sinuses: Secondary | ICD-10-CM | POA: Diagnosis not present

## 2017-09-14 DIAGNOSIS — J320 Chronic maxillary sinusitis: Secondary | ICD-10-CM

## 2017-09-27 ENCOUNTER — Ambulatory Visit (INDEPENDENT_AMBULATORY_CARE_PROVIDER_SITE_OTHER): Payer: Medicare Other | Admitting: Otolaryngology

## 2017-09-27 DIAGNOSIS — J342 Deviated nasal septum: Secondary | ICD-10-CM | POA: Diagnosis not present

## 2017-09-27 DIAGNOSIS — J320 Chronic maxillary sinusitis: Secondary | ICD-10-CM

## 2017-09-27 DIAGNOSIS — J343 Hypertrophy of nasal turbinates: Secondary | ICD-10-CM

## 2017-09-27 DIAGNOSIS — J321 Chronic frontal sinusitis: Secondary | ICD-10-CM

## 2017-10-05 DIAGNOSIS — Z0000 Encounter for general adult medical examination without abnormal findings: Secondary | ICD-10-CM | POA: Diagnosis not present

## 2017-10-05 DIAGNOSIS — I10 Essential (primary) hypertension: Secondary | ICD-10-CM | POA: Diagnosis not present

## 2017-10-05 DIAGNOSIS — J301 Allergic rhinitis due to pollen: Secondary | ICD-10-CM | POA: Diagnosis not present

## 2017-10-05 DIAGNOSIS — Z1389 Encounter for screening for other disorder: Secondary | ICD-10-CM | POA: Diagnosis not present

## 2017-10-05 DIAGNOSIS — Z6824 Body mass index (BMI) 24.0-24.9, adult: Secondary | ICD-10-CM | POA: Diagnosis not present

## 2017-10-05 DIAGNOSIS — E7849 Other hyperlipidemia: Secondary | ICD-10-CM | POA: Diagnosis not present

## 2017-10-22 DIAGNOSIS — M545 Low back pain: Secondary | ICD-10-CM | POA: Diagnosis not present

## 2017-10-22 DIAGNOSIS — E7849 Other hyperlipidemia: Secondary | ICD-10-CM | POA: Diagnosis not present

## 2017-10-22 DIAGNOSIS — I10 Essential (primary) hypertension: Secondary | ICD-10-CM | POA: Diagnosis not present

## 2017-10-26 ENCOUNTER — Other Ambulatory Visit: Payer: Self-pay | Admitting: Otolaryngology

## 2017-10-28 DIAGNOSIS — M810 Age-related osteoporosis without current pathological fracture: Secondary | ICD-10-CM | POA: Diagnosis not present

## 2017-10-28 DIAGNOSIS — M85851 Other specified disorders of bone density and structure, right thigh: Secondary | ICD-10-CM | POA: Diagnosis not present

## 2017-11-17 ENCOUNTER — Other Ambulatory Visit: Payer: Self-pay

## 2017-11-17 ENCOUNTER — Encounter (HOSPITAL_BASED_OUTPATIENT_CLINIC_OR_DEPARTMENT_OTHER): Payer: Self-pay | Admitting: *Deleted

## 2017-11-17 NOTE — Progress Notes (Signed)
Pre op phone call complete and pt to come in this week for EKG between 7-3 pm due to history of HTN.

## 2017-11-18 ENCOUNTER — Encounter (HOSPITAL_BASED_OUTPATIENT_CLINIC_OR_DEPARTMENT_OTHER)
Admission: RE | Admit: 2017-11-18 | Discharge: 2017-11-18 | Disposition: A | Discharge status: Home / Self Care | Payer: Medicare Other | Source: Ambulatory Visit | Attending: Otolaryngology | Admitting: Otolaryngology

## 2017-11-18 DIAGNOSIS — Z01810 Encounter for preprocedural cardiovascular examination: Secondary | ICD-10-CM | POA: Diagnosis not present

## 2017-11-19 DIAGNOSIS — M545 Low back pain: Secondary | ICD-10-CM | POA: Diagnosis not present

## 2017-11-19 DIAGNOSIS — I10 Essential (primary) hypertension: Secondary | ICD-10-CM | POA: Diagnosis not present

## 2017-11-19 DIAGNOSIS — E7849 Other hyperlipidemia: Secondary | ICD-10-CM | POA: Diagnosis not present

## 2017-11-22 ENCOUNTER — Encounter (HOSPITAL_BASED_OUTPATIENT_CLINIC_OR_DEPARTMENT_OTHER): Payer: Self-pay

## 2017-11-22 ENCOUNTER — Ambulatory Visit (HOSPITAL_BASED_OUTPATIENT_CLINIC_OR_DEPARTMENT_OTHER): Payer: Medicare Other | Admitting: Anesthesiology

## 2017-11-22 ENCOUNTER — Ambulatory Visit (HOSPITAL_BASED_OUTPATIENT_CLINIC_OR_DEPARTMENT_OTHER)
Admission: RE | Admit: 2017-11-22 | Discharge: 2017-11-22 | Disposition: A | Discharge status: Home / Self Care | Payer: Medicare Other | Source: Ambulatory Visit | Attending: Otolaryngology | Admitting: Otolaryngology

## 2017-11-22 ENCOUNTER — Other Ambulatory Visit: Payer: Self-pay

## 2017-11-22 ENCOUNTER — Encounter (HOSPITAL_BASED_OUTPATIENT_CLINIC_OR_DEPARTMENT_OTHER)
Admission: RE | Disposition: A | Discharge status: Home / Self Care | Payer: Self-pay | Source: Ambulatory Visit | Attending: Otolaryngology

## 2017-11-22 DIAGNOSIS — J343 Hypertrophy of nasal turbinates: Secondary | ICD-10-CM | POA: Diagnosis not present

## 2017-11-22 DIAGNOSIS — J338 Other polyp of sinus: Secondary | ICD-10-CM | POA: Insufficient documentation

## 2017-11-22 DIAGNOSIS — J322 Chronic ethmoidal sinusitis: Secondary | ICD-10-CM | POA: Diagnosis not present

## 2017-11-22 DIAGNOSIS — J342 Deviated nasal septum: Secondary | ICD-10-CM | POA: Insufficient documentation

## 2017-11-22 DIAGNOSIS — J321 Chronic frontal sinusitis: Secondary | ICD-10-CM | POA: Insufficient documentation

## 2017-11-22 DIAGNOSIS — I10 Essential (primary) hypertension: Secondary | ICD-10-CM | POA: Insufficient documentation

## 2017-11-22 DIAGNOSIS — J320 Chronic maxillary sinusitis: Secondary | ICD-10-CM | POA: Insufficient documentation

## 2017-11-22 DIAGNOSIS — Z79899 Other long term (current) drug therapy: Secondary | ICD-10-CM | POA: Diagnosis not present

## 2017-11-22 DIAGNOSIS — Z87891 Personal history of nicotine dependence: Secondary | ICD-10-CM | POA: Insufficient documentation

## 2017-11-22 DIAGNOSIS — J3489 Other specified disorders of nose and nasal sinuses: Secondary | ICD-10-CM | POA: Diagnosis not present

## 2017-11-22 DIAGNOSIS — J328 Other chronic sinusitis: Secondary | ICD-10-CM | POA: Diagnosis not present

## 2017-11-22 HISTORY — DX: Unspecified osteoarthritis, unspecified site: M19.90

## 2017-11-22 HISTORY — PX: FRONTAL SINUS EXPLORATION: SHX6591

## 2017-11-22 HISTORY — DX: Essential (primary) hypertension: I10

## 2017-11-22 HISTORY — PX: MAXILLARY ANTROSTOMY: SHX2003

## 2017-11-22 HISTORY — PX: ETHMOIDECTOMY: SHX5197

## 2017-11-22 HISTORY — PX: SINUS ENDO WITH FUSION: SHX5329

## 2017-11-22 HISTORY — PX: NASAL SEPTOPLASTY W/ TURBINOPLASTY: SHX2070

## 2017-11-22 SURGERY — SEPTOPLASTY, NOSE, WITH NASAL TURBINATE REDUCTION
Anesthesia: General | Site: Nose | Laterality: Right

## 2017-11-22 MED ORDER — MEPERIDINE HCL 25 MG/ML IJ SOLN: 6.2500 mg | INTRAMUSCULAR | Status: DC | PRN

## 2017-11-22 MED ORDER — LACTATED RINGERS IV SOLN
INTRAVENOUS | Status: DC
  Administered 2017-11-22 (×2): via INTRAVENOUS

## 2017-11-22 MED ORDER — PHENYLEPHRINE HCL 10 MG/ML IJ SOLN
INTRAMUSCULAR | Status: DC | PRN
  Administered 2017-11-22 (×3): 80 ug via INTRAVENOUS

## 2017-11-22 MED ORDER — ONDANSETRON HCL 4 MG/2ML IJ SOLN
INTRAMUSCULAR | Status: DC | PRN
  Administered 2017-11-22: 4 mg via INTRAVENOUS

## 2017-11-22 MED ORDER — MIDAZOLAM HCL 2 MG/2ML IJ SOLN: 1.0000 mg | INTRAMUSCULAR | Status: DC | PRN

## 2017-11-22 MED ORDER — CEFAZOLIN SODIUM-DEXTROSE 2-3 GM-%(50ML) IV SOLR
INTRAVENOUS | Status: DC | PRN
  Administered 2017-11-22: 2 g via INTRAVENOUS

## 2017-11-22 MED ORDER — OXYCODONE-ACETAMINOPHEN 5-325 MG PO TABS: 1.0000 | ORAL_TABLET | ORAL | 0 refills | Status: DC | PRN

## 2017-11-22 MED ORDER — HYDROMORPHONE HCL 1 MG/ML IJ SOLN: 0.2500 mg | INTRAMUSCULAR | Status: DC | PRN

## 2017-11-22 MED ORDER — EPHEDRINE SULFATE 50 MG/ML IJ SOLN
INTRAMUSCULAR | Status: DC | PRN
  Administered 2017-11-22 (×3): 10 mg via INTRAVENOUS

## 2017-11-22 MED ORDER — OXYMETAZOLINE HCL 0.05 % NA SOLN
NASAL | Status: DC | PRN
  Administered 2017-11-22: 1 via TOPICAL

## 2017-11-22 MED ORDER — OXYCODONE HCL 5 MG/5ML PO SOLN: 5.0000 mg | Freq: Once | ORAL | Status: AC | PRN

## 2017-11-22 MED ORDER — SUCCINYLCHOLINE CHLORIDE 20 MG/ML IJ SOLN
INTRAMUSCULAR | Status: DC | PRN
  Administered 2017-11-22: 120 mg via INTRAVENOUS

## 2017-11-22 MED ORDER — FENTANYL CITRATE (PF) 100 MCG/2ML IJ SOLN
50.0000 ug | INTRAMUSCULAR | Status: DC | PRN
  Administered 2017-11-22 (×2): 50 ug via INTRAVENOUS

## 2017-11-22 MED ORDER — SCOPOLAMINE 1 MG/3DAYS TD PT72: 1.0000 | MEDICATED_PATCH | Freq: Once | TRANSDERMAL | Status: DC | PRN

## 2017-11-22 MED ORDER — OXYCODONE HCL 5 MG PO TABS
ORAL_TABLET | ORAL | Status: AC
  Filled 2017-11-22: qty 1

## 2017-11-22 MED ORDER — DEXAMETHASONE SODIUM PHOSPHATE 4 MG/ML IJ SOLN
INTRAMUSCULAR | Status: DC | PRN
  Administered 2017-11-22: 10 mg via INTRAVENOUS

## 2017-11-22 MED ORDER — CEFAZOLIN SODIUM-DEXTROSE 2-4 GM/100ML-% IV SOLN
INTRAVENOUS | Status: AC
  Filled 2017-11-22: qty 100

## 2017-11-22 MED ORDER — MUPIROCIN 2 % EX OINT
TOPICAL_OINTMENT | CUTANEOUS | Status: DC | PRN
  Administered 2017-11-22: 1 via NASAL

## 2017-11-22 MED ORDER — PROPOFOL 10 MG/ML IV BOLUS
INTRAVENOUS | Status: DC | PRN
  Administered 2017-11-22: 140 mg via INTRAVENOUS

## 2017-11-22 MED ORDER — LIDOCAINE 2% (20 MG/ML) 5 ML SYRINGE
INTRAMUSCULAR | Status: DC | PRN
  Administered 2017-11-22: 80 mg via INTRAVENOUS

## 2017-11-22 MED ORDER — LIDOCAINE-EPINEPHRINE 1 %-1:100000 IJ SOLN
INTRAMUSCULAR | Status: DC | PRN
  Administered 2017-11-22: 4 mL

## 2017-11-22 MED ORDER — AMOXICILLIN 875 MG PO TABS: 875.0000 mg | ORAL_TABLET | Freq: Two times a day (BID) | ORAL | 0 refills | Status: AC

## 2017-11-22 MED ORDER — OXYCODONE HCL 5 MG PO TABS
5.0000 mg | ORAL_TABLET | Freq: Once | ORAL | Status: AC | PRN
  Administered 2017-11-22: 5 mg via ORAL

## 2017-11-22 MED ORDER — ONDANSETRON HCL 4 MG/2ML IJ SOLN
INTRAMUSCULAR | Status: AC
  Filled 2017-11-22: qty 2

## 2017-11-22 MED ORDER — DEXAMETHASONE SODIUM PHOSPHATE 10 MG/ML IJ SOLN
INTRAMUSCULAR | Status: AC
  Filled 2017-11-22: qty 1

## 2017-11-22 MED ORDER — LIDOCAINE 2% (20 MG/ML) 5 ML SYRINGE
INTRAMUSCULAR | Status: AC
  Filled 2017-11-22: qty 5

## 2017-11-22 MED ORDER — PROMETHAZINE HCL 25 MG/ML IJ SOLN: 6.2500 mg | INTRAMUSCULAR | Status: DC | PRN

## 2017-11-22 MED ORDER — FENTANYL CITRATE (PF) 100 MCG/2ML IJ SOLN
INTRAMUSCULAR | Status: AC
  Filled 2017-11-22: qty 2

## 2017-11-22 SURGICAL SUPPLY — 64 items
ATTRACTOMAT 16X20 MAGNETIC DRP (DRAPES) IMPLANT
BLADE RAD40 ROTATE 4M 4 5PK (BLADE) IMPLANT
BLADE RAD40 ROTATE 4M 4MM 5PK (BLADE)
BLADE RAD60 ROTATE M4 4 5PK (BLADE) IMPLANT
BLADE RAD60 ROTATE M4 4MM 5PK (BLADE)
BLADE ROTATE RAD 12 4 M4 (BLADE) IMPLANT
BLADE ROTATE RAD 12 4MM M4 (BLADE)
BLADE ROTATE RAD 40 4 M4 (BLADE) IMPLANT
BLADE ROTATE RAD 40 4MM M4 (BLADE)
BLADE ROTATE TRICUT 4MX13CM M4 (BLADE) ×1
BLADE ROTATE TRICUT 4X13 M4 (BLADE) ×3 IMPLANT
BLADE TRICUT ROTATE M4 4 5PK (BLADE) IMPLANT
BLADE TRICUT ROTATE M4 4MM 5PK (BLADE)
BUR HS RAD FRONTAL 3 (BURR) IMPLANT
BUR HS RAD FRONTAL 3MM (BURR)
CANISTER SUC SOCK COL 7IN (MISCELLANEOUS) ×8 IMPLANT
CANISTER SUCT 1200ML W/VALVE (MISCELLANEOUS) ×4 IMPLANT
COAGULATOR SUCT 6 FR SWTCH (ELECTROSURGICAL)
COAGULATOR SUCT 8FR VV (MISCELLANEOUS) ×4 IMPLANT
COAGULATOR SUCT SWTCH 10FR 6 (ELECTROSURGICAL) IMPLANT
DECANTER SPIKE VIAL GLASS SM (MISCELLANEOUS) IMPLANT
DRSG NASAL KENNEDY LMNT 8CM (GAUZE/BANDAGES/DRESSINGS) IMPLANT
DRSG NASOPORE 8CM (GAUZE/BANDAGES/DRESSINGS) ×4 IMPLANT
DRSG TELFA 3X8 NADH (GAUZE/BANDAGES/DRESSINGS) IMPLANT
ELECT REM PT RETURN 9FT ADLT (ELECTROSURGICAL) ×4
ELECTRODE REM PT RTRN 9FT ADLT (ELECTROSURGICAL) ×2 IMPLANT
GLOVE BIO SURGEON STRL SZ7 (GLOVE) ×4 IMPLANT
GLOVE BIO SURGEON STRL SZ7.5 (GLOVE) ×4 IMPLANT
GLOVE BIOGEL PI IND STRL 7.0 (GLOVE) ×2 IMPLANT
GLOVE BIOGEL PI INDICATOR 7.0 (GLOVE) ×2
GOWN STRL REUS W/ TWL LRG LVL3 (GOWN DISPOSABLE) ×4 IMPLANT
GOWN STRL REUS W/TWL LRG LVL3 (GOWN DISPOSABLE) ×4
HEMOSTAT SURGICEL 2X14 (HEMOSTASIS) IMPLANT
IV NS 1000ML (IV SOLUTION)
IV NS 1000ML BAXH (IV SOLUTION) IMPLANT
IV NS 500ML (IV SOLUTION) ×2
IV NS 500ML BAXH (IV SOLUTION) ×2 IMPLANT
NEEDLE HYPO 25X1 1.5 SAFETY (NEEDLE) ×4 IMPLANT
NEEDLE PRECISIONGLIDE 27X1.5 (NEEDLE) IMPLANT
NEEDLE SPNL 25GX3.5 QUINCKE BL (NEEDLE) IMPLANT
NS IRRIG 1000ML POUR BTL (IV SOLUTION) ×4 IMPLANT
PACK BASIN DAY SURGERY FS (CUSTOM PROCEDURE TRAY) ×4 IMPLANT
PACK ENT DAY SURGERY (CUSTOM PROCEDURE TRAY) ×4 IMPLANT
PACKING NASAL EPIS 4X2.4 XEROG (MISCELLANEOUS) IMPLANT
SLEEVE SCD COMPRESS KNEE MED (MISCELLANEOUS) ×4 IMPLANT
SOLUTION BUTLER CLEAR DIP (MISCELLANEOUS) ×4 IMPLANT
SPLINT NASAL AIRWAY SILICONE (MISCELLANEOUS) ×4 IMPLANT
SPONGE GAUZE 2X2 8PLY STER LF (GAUZE/BANDAGES/DRESSINGS) ×1
SPONGE GAUZE 2X2 8PLY STRL LF (GAUZE/BANDAGES/DRESSINGS) ×3 IMPLANT
SPONGE NEURO XRAY DETECT 1X3 (DISPOSABLE) ×4 IMPLANT
SUT CHROMIC 4 0 P 3 18 (SUTURE) ×4 IMPLANT
SUT PLAIN 4 0 ~~LOC~~ 1 (SUTURE) ×4 IMPLANT
SUT PROLENE 3 0 PS 2 (SUTURE) ×4 IMPLANT
SUT VIC AB 4-0 P-3 18XBRD (SUTURE) IMPLANT
SUT VIC AB 4-0 P3 18 (SUTURE)
TOWEL GREEN STERILE FF (TOWEL DISPOSABLE) ×4 IMPLANT
TRACKER ENT INSTRUMENT (MISCELLANEOUS) ×4 IMPLANT
TRACKER ENT PATIENT (MISCELLANEOUS) ×4 IMPLANT
TUBE CONNECTING 20'X1/4 (TUBING) ×1
TUBE CONNECTING 20X1/4 (TUBING) ×3 IMPLANT
TUBE SALEM SUMP 12R W/ARV (TUBING) IMPLANT
TUBE SALEM SUMP 16 FR W/ARV (TUBING) ×4 IMPLANT
TUBING STRAIGHTSHOT EPS 5PK (TUBING) ×4 IMPLANT
YANKAUER SUCT BULB TIP NO VENT (SUCTIONS) ×4 IMPLANT

## 2017-11-22 NOTE — Transfer of Care (Signed)
Immediate Anesthesia Transfer of Care Note  Patient: [name]  Procedure(s) Performed: NASAL SEPTOPLASTY WITH BILATERAL TURBINATE REDUCTION (Bilateral Nose) MAXILLARY ANTROSTOMY WITH TISSUE REMOVAL (Right Nose) RIGHT TOTAL ETHMOIDECTOMY (Right Nose) FRONTAL SINUS EXPLORATION (Right Nose) SINUS ENDOSCOPY WITH FUSION NAVIGATION (Bilateral Nose)  Patient Location: PACU  Anesthesia Type:General  Level of Consciousness: sedated  Airway & Oxygen Therapy: Patient Spontanous Breathing and Patient connected to face mask oxygen  Post-op Assessment: Report given to RN and Post -op Vital signs reviewed and stable  Post vital signs: Reviewed and stable  Last Vitals:  Vitals Value Taken Time  BP    Temp    Pulse 74 11/22/2017 11:17 AM  Resp 7 11/22/2017 11:17 AM  SpO2 99 % 11/22/2017 11:17 AM  Vitals shown include unvalidated device data.  Last Pain:  Vitals:   11/22/17 0809  TempSrc: Oral  PainSc:       Patients Stated Pain Goal: 0 (48/01/65 5374)  Complications: No apparent anesthesia complications

## 2017-11-22 NOTE — Anesthesia Procedure Notes (Signed)
Procedure Name: Intubation Date/Time: 11/22/2017 9:32 AM Performed by: Maryella Shivers, CRNA Pre-anesthesia Checklist: Patient identified, Emergency Drugs available, Suction available and Patient being monitored Patient Re-evaluated:Patient Re-evaluated prior to induction Oxygen Delivery Method: Circle system utilized Preoxygenation: Pre-oxygenation with 100% oxygen Induction Type: IV induction Ventilation: Mask ventilation without difficulty Laryngoscope Size: Mac and 4 Grade View: Grade II Tube type: Oral Tube size: 8.0 mm Number of attempts: 1 Airway Equipment and Method: Stylet and Oral airway Placement Confirmation: ETT inserted through vocal cords under direct vision,  positive ETCO2 and breath sounds checked- equal and bilateral Secured at: 22 cm Tube secured with: Tape Dental Injury: Teeth and Oropharynx as per pre-operative assessment

## 2017-11-22 NOTE — Anesthesia Preprocedure Evaluation (Signed)
Anesthesia Evaluation  Patient identified by MRN, date of birth, ID band Patient awake    Reviewed: Allergy & Precautions, H&P , NPO status , Patient's Chart, lab work & pertinent test results  Airway Mallampati: II  TM Distance: >3 FB Neck ROM: Full    Dental no notable dental hx. (+) Dental Advisory Given, Teeth Intact   Pulmonary neg pulmonary ROS, former smoker,    Pulmonary exam normal breath sounds clear to auscultation       Cardiovascular hypertension, Pt. on medications negative cardio ROS Normal cardiovascular exam Rhythm:Regular Rate:Normal     Neuro/Psych negative neurological ROS  negative psych ROS   GI/Hepatic negative GI ROS, Neg liver ROS,   Endo/Other  negative endocrine ROS  Renal/GU negative Renal ROS  negative genitourinary   Musculoskeletal negative musculoskeletal ROS (+)   Abdominal   Peds negative pediatric ROS (+)  Hematology negative hematology ROS (+)   Anesthesia Other Findings   Reproductive/Obstetrics negative OB ROS                             Anesthesia Physical  Anesthesia Plan  ASA: II  Anesthesia Plan: General   Post-op Pain Management:    Induction: Intravenous  PONV Risk Score and Plan: 2 and Dexamethasone, Midazolam and Treatment may vary due to age or medical condition  Airway Management Planned: Oral ETT  Additional Equipment:   Intra-op Plan:   Post-operative Plan: Extubation in OR  Informed Consent: I have reviewed the patients History and Physical, chart, labs and discussed the procedure including the risks, benefits and alternatives for the proposed anesthesia with the patient or authorized representative who has indicated his/her understanding and acceptance.   Dental advisory given  Plan Discussed with: CRNA, Anesthesiologist and Surgeon  Anesthesia Plan Comments:         Anesthesia Quick Evaluation

## 2017-11-22 NOTE — Discharge Instructions (Addendum)

## 2017-11-22 NOTE — Op Note (Signed)
DATE OF PROCEDURE: 11/22/2017  OPERATIVE REPORT   SURGEON: Leta Baptist, MD   PREOPERATIVE DIAGNOSES:  1. Nasal septal deviation.  2. Bilateral inferior turbinate hypertrophy.  3. Chronic nasal obstruction. 4. Chronic right maxillary, ethmoid, and frontal sinusitis and polyposis  POSTOPERATIVE DIAGNOSES:  1. Nasal septal deviation.  2. Bilateral inferior turbinate hypertrophy.  3. Chronic nasal obstruction. 4. Chronic right maxillary, ethmoid, and frontal sinusitis and polyposis  PROCEDURE PERFORMED:  1. Septoplasty.  2. Bilateral partial inferior turbinate resection.  3. Endoscopic right frontal sinusotomy and total ethmoidectomy with polyp removal. 4. Endoscopic right maxillary antrostomy with polyp removal. 5. FUSION stereotactic image guidance.  ANESTHESIA: General endotracheal tube anesthesia.   COMPLICATIONS: None.   ESTIMATED BLOOD LOSS: 250 mL.   INDICATION FOR PROCEDURE: [name] is a 77 y.o. male with a history of chronic nasal obstruction and recurrent rhinosinusitis. The patient was treated with antihistamine, decongestant, steroid nasal spray, multiple antibiotics and systemic steroids. However, the patient continued to be symptomatic. On examination, the patient was noted to have bilateral severe inferior turbinate hypertrophy and significant nasal septal deviation, causing significant nasal obstruction. He also has a large obstructing right nasal polyp. His CT scan showed mucosal thickening and partial opacification of the right maxillary, ethmoid, and frontal sinuses. Based on the above findings, the decision was made for the patient to undergo the above-stated procedures. The risks, benefits, alternatives, and details of the procedures were discussed with the patient. Questions were invited and answered. Informed consent was obtained.   DESCRIPTION OF PROCEDURE: The patient was taken to the operating room and placed supine on the operating table. General  endotracheal tube anesthesia was administered by the anesthesiologist. The patient was positioned, and prepped and draped in the standard fashion for nasal surgery. Pledgets soaked with Afrin were placed in both nasal cavities for decongestion. The pledgets were subsequently removed. The above mentioned severe septal deviation was again noted. 1% lidocaine with 1:100,000 epinephrine was injected onto the nasal septum bilaterally. A hemitransfixion incision was made on the left side. The mucosal flap was carefully elevated on the left side. A cartilaginous incision was made 1 cm superior to the caudal margin of the nasal septum. Mucosal flap was also elevated on the right side in the similar fashion. It should be noted that due to the severe septal deviation, the deviated portion of the cartilaginous and bony septum had to be removed in piecemeal fashion. Once the deviated portions were removed, a straight midline septum was achieved. The septum was then quilted with 4-0 plain gut sutures. The hemitransfixion incision was closed with interrupted 4-0 chromic sutures.   The inferior one half of both hypertrophied inferior turbinate was crossclamped with a Kelly clamp. The inferior one half of each inferior turbinate was then resected with a pair of cross cutting scissors. Hemostasis was achieved with a suction cautery device.   Attention was then focused on the right paranasal sinuses. Using a 0 endoscope, the right nasal cavity was examined. A large obstructing polyp was noted, emanating from the right middle meatus. The right middle turbinate was carefully medialized. The polypoid tissue was removed in a piecemeal fashion using a combination of Blakesley forceps and microdebrider. The uncinate process was resected with a freer elevator. The maxillary antrum was entered and enlarged using a combination of backbiting forceps and microdebrider. Polypoid tissue was removed from the right maxillary sinus. The bony  partitions of the right anterior and posterior ethmoid cavities were then taken down using  the microdebrider. A large amount of polypoid tissue was removed from the ethmoid sinuses. The ethmoid and maxillary cavities were copiously irrigated.  Attention was then focused on the frontal sinus. The frontal recess was enlarged using Tru-Cut forceps and microdebrider. Polypoid tissue was removed from the frontal recess. The frontal sinus was then entered and copiously irrigated. Hemostasis was achieved with the Nasopore packing.  The care of the patient was turned over to the anesthesiologist. The patient was awakened from anesthesia without difficulty. The patient was extubated and transferred to the recovery room in good condition.   OPERATIVE FINDINGS: Severe nasal septal deviation and bilateral inferior turbinate hypertrophy. Right maxillary, ethmoid, and frontal sinusitis and polyposis.  SPECIMEN: Right sinus contents.   FOLLOWUP CARE: The patient be discharged home once he is awake and alert. The patient will be placed on Percocet 1 tablets p.o. q.4 hours p.r.n. pain, and amoxicillin 875 mg p.o. b.i.d. for 5 days. The patient will follow up in my office in 3 days for splint removal.   [name], MD

## 2017-11-22 NOTE — H&P (Signed)
Cc: Nasal polyps, nasal obstruction, recurrent sinusitis  HPI: The patient is a 77 year old male who returns today for his follow-up evaluation.  The patient was previously seen for his large obstructing right nasal polyp.  He also has a history of chronic right sided paranasal sinus diseases.  He was treated with multiple courses of antibiotics and high dose prednisone.  The patient returns today reporting moderate improvement in his nasal breathing.  He continues to have occasional nasal congestion, especially on the right side. It should be noted that his nasal obstruction is chronic and has been present for more than 10 years.  He has no previous history of sinonasal surgery.   Exam: The nasal cavities were decongested and anesthetised with a combination of oxymetazoline and 4% lidocaine solution.  The flexible scope was inserted into the right nasal cavity.  Endoscopy of the inferior and middle meatus was performed.  Edematous mucosa was noted. A right nasal polyp was noted. Right septal spur.  Nasopharynx was clear.  Turbinates were hypertrophied but without mass.  Incomplete response to decongestion.  The procedure was repeated on the contralateral side with similar findings.  The patient tolerated the procedure well.  Instructions were given to avoid eating or drinking for 2 hours.    Assessment: 1.  The patient continues to have a right nasal polyp, extending from the right middle meatus.   2.  Bilateral chronic nasal obstruction, with bilateral inferior turbinate hypertrophy and bilateral septal spurs.  More than 95% of his nasal passageways are obstructed bilaterally.  3.  Right-sided paranasal sinus disease.  CT showed mucosal thickening and partial opacification within the right maxillary, ethmoid and frontal recess.   Plan: 1.  The nasal endoscopy findings and the CT images are reviewed with the patient.  2.  In slight of his persistent symptoms, which have not responded to medical  treatment, he will likely benefit from undergoing surgical intervention with right sided endoscopic sinus surgery, septoplasty, and bilateral inferior turbinate reduction.  3.  The risks, benefits, and details of the procedures are reviewed.  4.  The patient would like to proceed with the procedure.

## 2017-11-23 ENCOUNTER — Encounter (HOSPITAL_BASED_OUTPATIENT_CLINIC_OR_DEPARTMENT_OTHER): Payer: Self-pay | Admitting: Otolaryngology

## 2017-11-23 NOTE — Anesthesia Postprocedure Evaluation (Signed)
Anesthesia Post Note  Patient: KOSTAS MARROW  Procedure(s) Performed: NASAL SEPTOPLASTY WITH BILATERAL TURBINATE REDUCTION (Bilateral Nose) MAXILLARY ANTROSTOMY WITH TISSUE REMOVAL (Right Nose) RIGHT TOTAL ETHMOIDECTOMY (Right Nose) FRONTAL SINUS EXPLORATION (Right Nose) SINUS ENDOSCOPY WITH FUSION NAVIGATION (Bilateral Nose)     Patient location during evaluation: PACU Anesthesia Type: General Level of consciousness: awake and alert Pain management: pain level controlled Vital Signs Assessment: post-procedure vital signs reviewed and stable Respiratory status: spontaneous breathing, nonlabored ventilation and respiratory function stable Cardiovascular status: blood pressure returned to baseline and stable Postop Assessment: no apparent nausea or vomiting Anesthetic complications: no    Last Vitals:  Vitals:   11/22/17 1200 11/22/17 1223  BP: 136/71 135/81  Pulse: 70 72  Resp: 16 18  Temp:  36.6 C  SpO2: 97% 98%    Last Pain:  Vitals:   11/23/17 0913  TempSrc:   PainSc: 1    Pain Goal: Patients Stated Pain Goal: 0 (11/22/17 0809)               Lynda Rainwater

## 2017-11-25 ENCOUNTER — Ambulatory Visit (INDEPENDENT_AMBULATORY_CARE_PROVIDER_SITE_OTHER): Payer: Medicare Other | Admitting: Otolaryngology

## 2017-11-25 DIAGNOSIS — J321 Chronic frontal sinusitis: Secondary | ICD-10-CM

## 2017-11-25 DIAGNOSIS — J320 Chronic maxillary sinusitis: Secondary | ICD-10-CM

## 2017-11-25 DIAGNOSIS — J322 Chronic ethmoidal sinusitis: Secondary | ICD-10-CM

## 2017-12-13 ENCOUNTER — Ambulatory Visit (INDEPENDENT_AMBULATORY_CARE_PROVIDER_SITE_OTHER): Payer: Medicare Other | Admitting: Otolaryngology

## 2017-12-13 DIAGNOSIS — J322 Chronic ethmoidal sinusitis: Secondary | ICD-10-CM | POA: Diagnosis not present

## 2017-12-13 DIAGNOSIS — J321 Chronic frontal sinusitis: Secondary | ICD-10-CM

## 2017-12-13 DIAGNOSIS — J320 Chronic maxillary sinusitis: Secondary | ICD-10-CM

## 2018-01-03 DIAGNOSIS — I10 Essential (primary) hypertension: Secondary | ICD-10-CM | POA: Diagnosis not present

## 2018-01-03 DIAGNOSIS — E7849 Other hyperlipidemia: Secondary | ICD-10-CM | POA: Diagnosis not present

## 2018-01-03 DIAGNOSIS — M545 Low back pain: Secondary | ICD-10-CM | POA: Diagnosis not present

## 2018-01-17 ENCOUNTER — Ambulatory Visit (INDEPENDENT_AMBULATORY_CARE_PROVIDER_SITE_OTHER): Payer: Medicare Other | Admitting: Otolaryngology

## 2018-01-17 DIAGNOSIS — E7849 Other hyperlipidemia: Secondary | ICD-10-CM | POA: Diagnosis not present

## 2018-01-17 DIAGNOSIS — Z0000 Encounter for general adult medical examination without abnormal findings: Secondary | ICD-10-CM | POA: Diagnosis not present

## 2018-01-17 DIAGNOSIS — I10 Essential (primary) hypertension: Secondary | ICD-10-CM | POA: Diagnosis not present

## 2018-01-17 DIAGNOSIS — Z1389 Encounter for screening for other disorder: Secondary | ICD-10-CM | POA: Diagnosis not present

## 2018-01-17 DIAGNOSIS — J321 Chronic frontal sinusitis: Secondary | ICD-10-CM | POA: Diagnosis not present

## 2018-01-17 DIAGNOSIS — J320 Chronic maxillary sinusitis: Secondary | ICD-10-CM | POA: Diagnosis not present

## 2018-01-17 DIAGNOSIS — J322 Chronic ethmoidal sinusitis: Secondary | ICD-10-CM | POA: Diagnosis not present

## 2018-01-17 DIAGNOSIS — J301 Allergic rhinitis due to pollen: Secondary | ICD-10-CM | POA: Diagnosis not present

## 2018-02-15 DIAGNOSIS — E7849 Other hyperlipidemia: Secondary | ICD-10-CM | POA: Diagnosis not present

## 2018-02-15 DIAGNOSIS — I10 Essential (primary) hypertension: Secondary | ICD-10-CM | POA: Diagnosis not present

## 2018-02-15 DIAGNOSIS — E1121 Type 2 diabetes mellitus with diabetic nephropathy: Secondary | ICD-10-CM | POA: Diagnosis not present

## 2018-03-11 DIAGNOSIS — E7849 Other hyperlipidemia: Secondary | ICD-10-CM | POA: Diagnosis not present

## 2018-03-11 DIAGNOSIS — I10 Essential (primary) hypertension: Secondary | ICD-10-CM | POA: Diagnosis not present

## 2018-03-11 DIAGNOSIS — E1121 Type 2 diabetes mellitus with diabetic nephropathy: Secondary | ICD-10-CM | POA: Diagnosis not present

## 2018-04-20 DIAGNOSIS — E1121 Type 2 diabetes mellitus with diabetic nephropathy: Secondary | ICD-10-CM | POA: Diagnosis not present

## 2018-04-20 DIAGNOSIS — E7849 Other hyperlipidemia: Secondary | ICD-10-CM | POA: Diagnosis not present

## 2018-04-20 DIAGNOSIS — I10 Essential (primary) hypertension: Secondary | ICD-10-CM | POA: Diagnosis not present

## 2018-04-26 DIAGNOSIS — E7849 Other hyperlipidemia: Secondary | ICD-10-CM | POA: Diagnosis not present

## 2018-04-26 DIAGNOSIS — J40 Bronchitis, not specified as acute or chronic: Secondary | ICD-10-CM | POA: Diagnosis not present

## 2018-04-26 DIAGNOSIS — I10 Essential (primary) hypertension: Secondary | ICD-10-CM | POA: Diagnosis not present

## 2018-04-26 DIAGNOSIS — Z0000 Encounter for general adult medical examination without abnormal findings: Secondary | ICD-10-CM | POA: Diagnosis not present

## 2018-04-26 DIAGNOSIS — Z1389 Encounter for screening for other disorder: Secondary | ICD-10-CM | POA: Diagnosis not present

## 2018-05-19 DIAGNOSIS — E7849 Other hyperlipidemia: Secondary | ICD-10-CM | POA: Diagnosis not present

## 2018-05-19 DIAGNOSIS — I10 Essential (primary) hypertension: Secondary | ICD-10-CM | POA: Diagnosis not present

## 2018-05-19 DIAGNOSIS — M545 Low back pain: Secondary | ICD-10-CM | POA: Diagnosis not present

## 2018-07-21 DIAGNOSIS — I10 Essential (primary) hypertension: Secondary | ICD-10-CM | POA: Diagnosis not present

## 2018-07-21 DIAGNOSIS — E7849 Other hyperlipidemia: Secondary | ICD-10-CM | POA: Diagnosis not present

## 2018-07-21 DIAGNOSIS — M545 Low back pain: Secondary | ICD-10-CM | POA: Diagnosis not present

## 2018-08-01 DIAGNOSIS — H26491 Other secondary cataract, right eye: Secondary | ICD-10-CM | POA: Diagnosis not present

## 2018-08-04 DIAGNOSIS — Z0000 Encounter for general adult medical examination without abnormal findings: Secondary | ICD-10-CM | POA: Diagnosis not present

## 2018-08-04 DIAGNOSIS — I10 Essential (primary) hypertension: Secondary | ICD-10-CM | POA: Diagnosis not present

## 2018-08-04 DIAGNOSIS — E7849 Other hyperlipidemia: Secondary | ICD-10-CM | POA: Diagnosis not present

## 2018-08-19 DIAGNOSIS — I10 Essential (primary) hypertension: Secondary | ICD-10-CM | POA: Diagnosis not present

## 2018-08-19 DIAGNOSIS — E7849 Other hyperlipidemia: Secondary | ICD-10-CM | POA: Diagnosis not present

## 2018-09-15 DIAGNOSIS — I10 Essential (primary) hypertension: Secondary | ICD-10-CM | POA: Diagnosis not present

## 2018-09-15 DIAGNOSIS — E7849 Other hyperlipidemia: Secondary | ICD-10-CM | POA: Diagnosis not present

## 2018-10-08 IMAGING — CT CT MAXILLOFACIAL W/O CM
3 series · 11 of 47 positions shown, 13 images · non-contrast
Comparison: 08/08/2017 CT head

CLINICAL DATA: 76 y/o  M; right-sided nose mass.

EXAM:
CT MAXILLOFACIAL WITHOUT CONTRAST
TECHNIQUE: Multidetector CT images of the paranasal sinuses were obtained using
the standard protocol without intravenous contrast.

[Series 2: standard · axial · 0.34mm/px · z∈[+1453,+1531]mm · 5 of 100 slices shown, 7 images]
[im 11/100  brain]
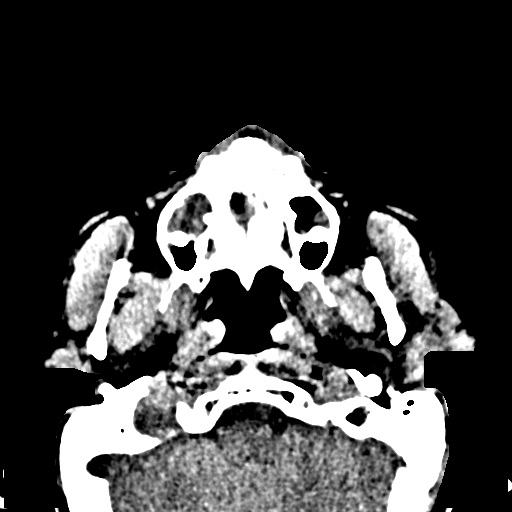
[im 11/100  bone]
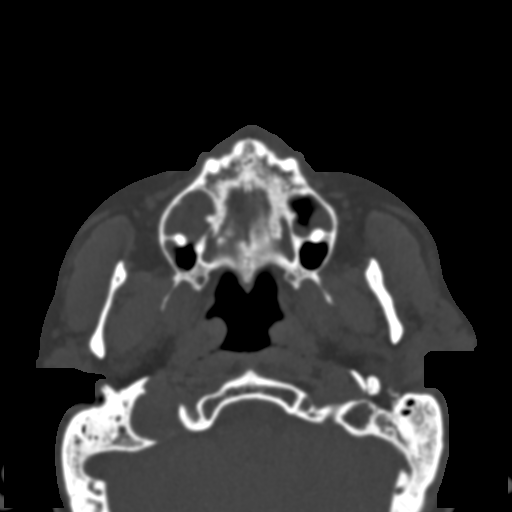
[im 31/100  bone]
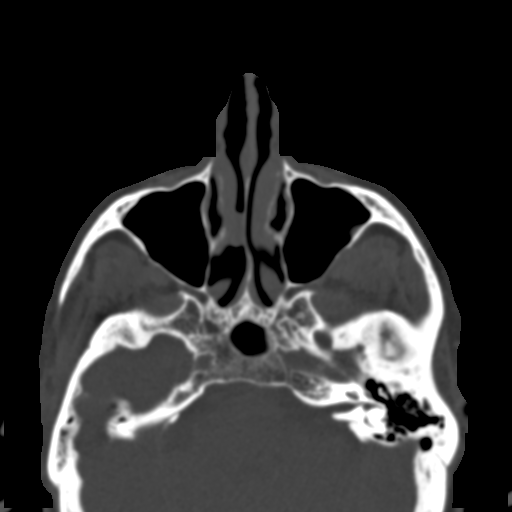
[im 52/100  bone]
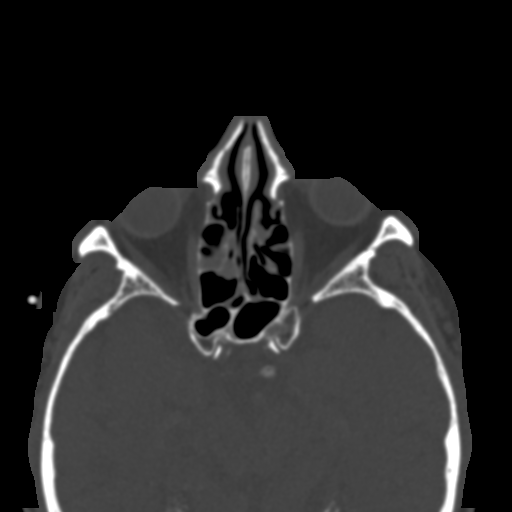
[im 69/100  bone]
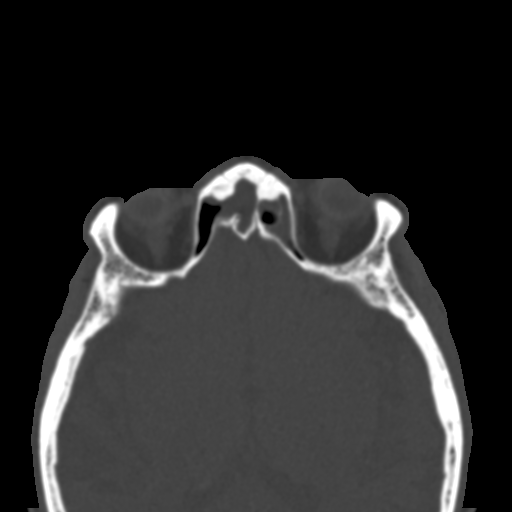
[im 89/100  brain]
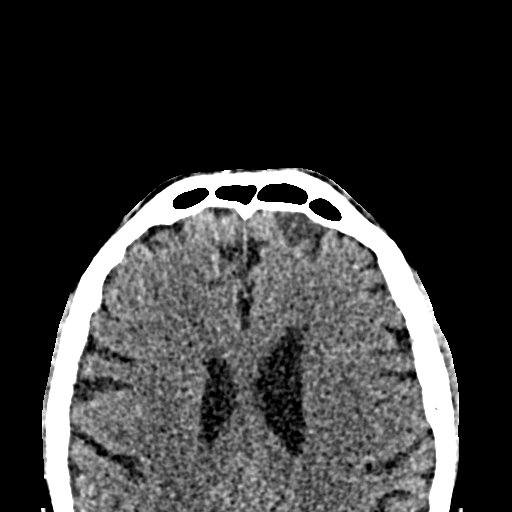
[im 89/100  bone]
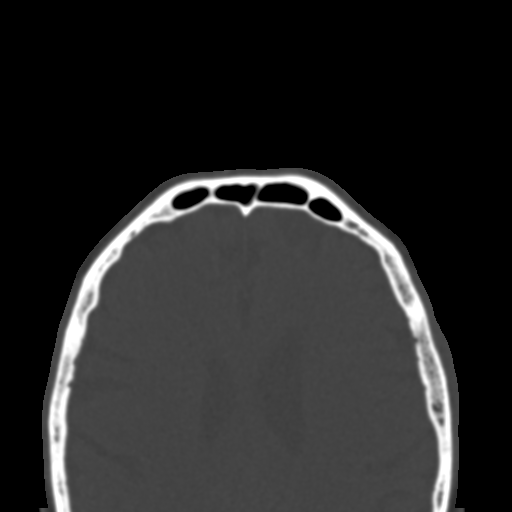

[Series 4: coronal · coronal · 0.19mm/px · 3 of 78 slices shown]
[im 26/78  bone]
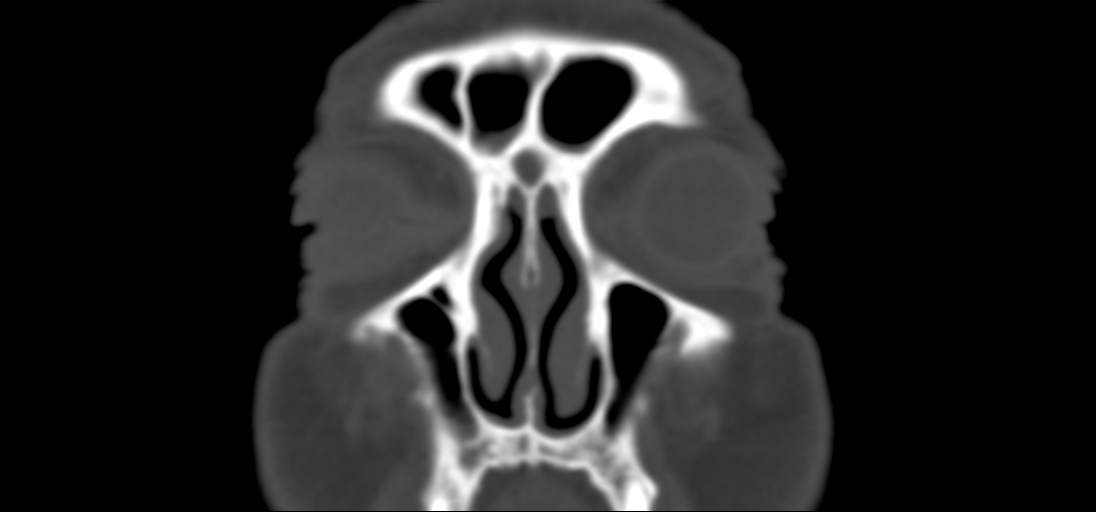
[im 35/78  bone]
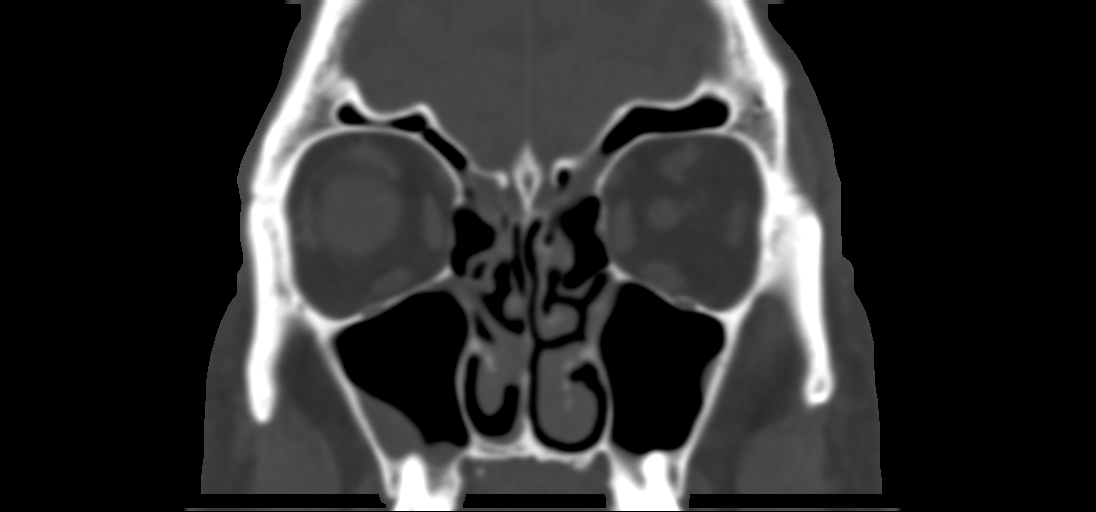
[im 43/78  bone]
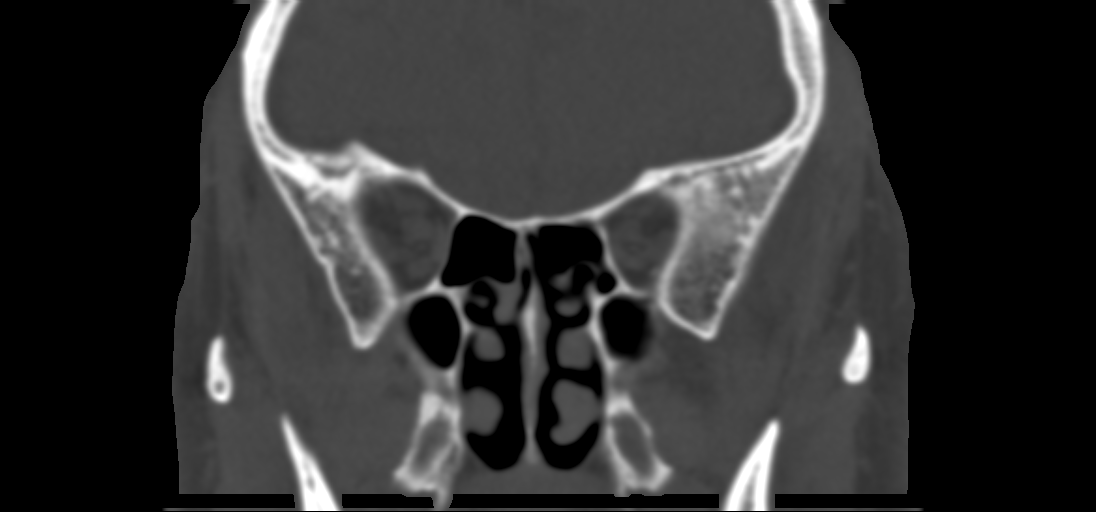

[Series 5: sagittal · sagittal · 0.19mm/px · 3 of 101 slices shown]
[im 34/101  bone]
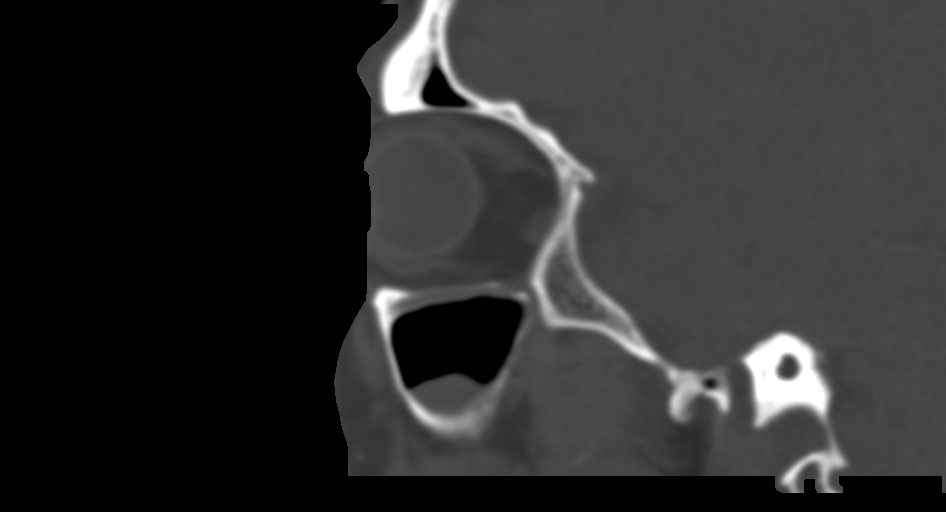
[im 51/101  bone]
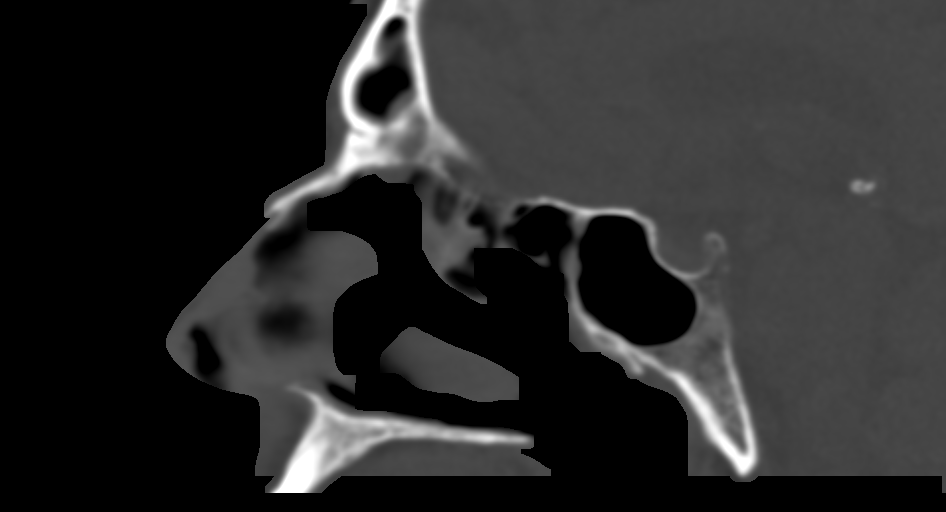
[im 67/101  bone]
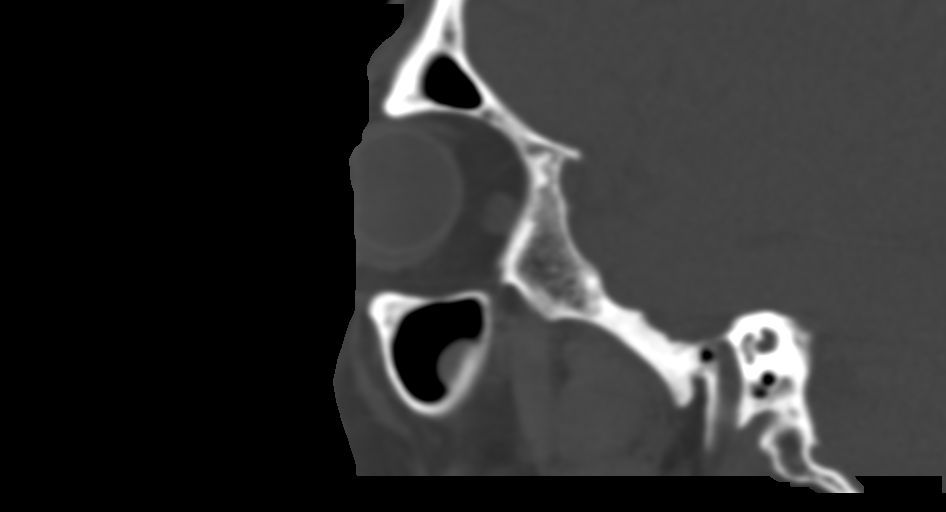

[11 of 47 positions shown; findings below may reference images not displayed]

FINDINGS: Paranasal sinuses:

Frontal: Mild mucosal thickening. Partial opacification of the right
frontal sinus drainage pathway. Patent left frontal sinus drainage
pathway.

Ethmoid: Moderate diffuse mucosal thickening and partial
opacification of the right anterior ethmoid air cells.

Maxillary: Mild mucosal thickening with multiple small mucous
retention cyst/polyps, greater on the right.

Sphenoid: Polypoid mucosal thickening near the patent sphenoid
ethmoid recesses.

Right ostiomeatal unit: Patent with moderate mucosal thickening of
the infundibulum.

Left ostiomeatal unit: Patent with mild mucosal thickening of the
infundibulum.

Nasal passages: Decreased size of polypoid lesion with a stalk
arising from the right middle meatus (series 4, image 36). A polyp
now measures 11 mm in axial plane (series 2, image 30). Intact nasal
septum is midline.

Anatomy: Pneumatization superior to anterior ethmoid notches. Left
olfactory groove is 1 mm higher than the right, left fovea
ethmoidalis is 3 mm higher than the right, right Keros II, left
Keros III. Sellar sphenoid pneumatization pattern. No dehiscence of
carotid or optic canals. No onodi cell.

Other: Orbits and intracranial compartment are unremarkable. Visible
mastoid air cells are normally aerated.
IMPRESSION: 1. Marked decrease in size of polypoid lesion in the right nasal
passages with stalk arising from the right middle meatus.
2. Mild diffuse paranasal sinus mucosal thickening. Patent sinus
drainage pathways.

By: Raechel Parkins M.D.

## 2018-10-13 DIAGNOSIS — M170 Bilateral primary osteoarthritis of knee: Secondary | ICD-10-CM | POA: Diagnosis not present

## 2018-10-13 DIAGNOSIS — M25562 Pain in left knee: Secondary | ICD-10-CM | POA: Diagnosis not present

## 2018-10-26 DIAGNOSIS — H27121 Anterior dislocation of lens, right eye: Secondary | ICD-10-CM | POA: Diagnosis not present

## 2018-10-27 DIAGNOSIS — I10 Essential (primary) hypertension: Secondary | ICD-10-CM | POA: Diagnosis not present

## 2018-10-27 DIAGNOSIS — E7849 Other hyperlipidemia: Secondary | ICD-10-CM | POA: Diagnosis not present

## 2018-11-02 DIAGNOSIS — M1712 Unilateral primary osteoarthritis, left knee: Secondary | ICD-10-CM | POA: Diagnosis not present

## 2018-11-03 DIAGNOSIS — H27121 Anterior dislocation of lens, right eye: Secondary | ICD-10-CM | POA: Diagnosis not present

## 2018-11-08 DIAGNOSIS — J309 Allergic rhinitis, unspecified: Secondary | ICD-10-CM | POA: Diagnosis not present

## 2018-11-08 DIAGNOSIS — E7849 Other hyperlipidemia: Secondary | ICD-10-CM | POA: Diagnosis not present

## 2018-11-08 DIAGNOSIS — I10 Essential (primary) hypertension: Secondary | ICD-10-CM | POA: Diagnosis not present

## 2018-11-09 DIAGNOSIS — M1712 Unilateral primary osteoarthritis, left knee: Secondary | ICD-10-CM | POA: Diagnosis not present

## 2018-11-14 DIAGNOSIS — H27121 Anterior dislocation of lens, right eye: Secondary | ICD-10-CM | POA: Diagnosis not present

## 2018-11-14 DIAGNOSIS — H5789 Other specified disorders of eye and adnexa: Secondary | ICD-10-CM | POA: Diagnosis not present

## 2018-11-14 DIAGNOSIS — Z01818 Encounter for other preprocedural examination: Secondary | ICD-10-CM | POA: Diagnosis not present

## 2018-11-16 DIAGNOSIS — M1712 Unilateral primary osteoarthritis, left knee: Secondary | ICD-10-CM | POA: Diagnosis not present

## 2018-11-21 DIAGNOSIS — H2000 Unspecified acute and subacute iridocyclitis: Secondary | ICD-10-CM | POA: Diagnosis not present

## 2018-11-24 DIAGNOSIS — E7849 Other hyperlipidemia: Secondary | ICD-10-CM | POA: Diagnosis not present

## 2018-11-24 DIAGNOSIS — I10 Essential (primary) hypertension: Secondary | ICD-10-CM | POA: Diagnosis not present

## 2018-12-01 DIAGNOSIS — H26491 Other secondary cataract, right eye: Secondary | ICD-10-CM | POA: Diagnosis not present

## 2018-12-29 DIAGNOSIS — M25562 Pain in left knee: Secondary | ICD-10-CM | POA: Diagnosis not present

## 2018-12-29 DIAGNOSIS — E7849 Other hyperlipidemia: Secondary | ICD-10-CM | POA: Diagnosis not present

## 2018-12-29 DIAGNOSIS — I10 Essential (primary) hypertension: Secondary | ICD-10-CM | POA: Diagnosis not present

## 2019-01-30 DIAGNOSIS — M25662 Stiffness of left knee, not elsewhere classified: Secondary | ICD-10-CM | POA: Diagnosis not present

## 2019-01-30 DIAGNOSIS — M25562 Pain in left knee: Secondary | ICD-10-CM | POA: Diagnosis not present

## 2019-01-30 DIAGNOSIS — M1712 Unilateral primary osteoarthritis, left knee: Secondary | ICD-10-CM | POA: Diagnosis not present

## 2019-02-07 DIAGNOSIS — J309 Allergic rhinitis, unspecified: Secondary | ICD-10-CM | POA: Diagnosis not present

## 2019-02-07 DIAGNOSIS — E7849 Other hyperlipidemia: Secondary | ICD-10-CM | POA: Diagnosis not present

## 2019-02-07 DIAGNOSIS — I10 Essential (primary) hypertension: Secondary | ICD-10-CM | POA: Diagnosis not present

## 2019-02-14 NOTE — Patient Instructions (Signed)
DUE TO COVID-19 ONLY ONE VISITOR IS ALLOWED TO COME WITH YOU AND STAY IN THE WAITING ROOM ONLY DURING PRE OP AND PROCEDURE DAY OF SURGERY. THE 1 VISITOR MAY VISIT WITH YOU AFTER SURGERY IN YOUR PRIVATE ROOM DURING VISITING HOURS ONLY!  YOU NEED TO HAVE A COVID 19 TEST ON___11-19____ @_______ , THIS TEST MUST BE DONE BEFORE SURGERY, COME  [name] , [id number].  ([place]) ONCE YOUR COVID TEST IS COMPLETED, PLEASE BEGIN THE QUARANTINE INSTRUCTIONS AS OUTLINED IN YOUR HANDOUT.                [name]    Your procedure is scheduled on: 02-20-2019   Report to [facility] Main  Entrance   Report to admitting at 8:00AM     Call this number if you have problems the morning of surgery [phone]    NO SOLID FOOD AFTER MIDNIGHT THE NIGHT PRIOR TO SURGERY. YOU MAY DRINK CLEAR LIQUIDS.   STOP CLEAR LIQUIDS AT ___7:30AM______ AND THEN DRINK THE ENSURE PRE-SURGERY [place]. NOTHING BY MOUTH AFTER THE ENSURE DRINK!    CLEAR LIQUID DIET   Foods Allowed                                                                     Foods Excluded  Coffee and tea, regular and decaf                             liquids that you cannot  Plain Jell-O any favor except red or purple                                           see through such as: Fruit ices (not with fruit pulp)                                     milk, soups, orange juice  Iced Popsicles                                    All solid food Carbonated beverages, regular and diet                                    Cranberry, grape and apple juices Sports drinks like Gatorade Lightly seasoned clear broth or consume(fat free) Sugar, honey syrup  Sample Menu Breakfast                                Lunch                                     Supper Cranberry juice  Beef broth                            Chicken broth Jell-O                                     Grape juice                            Apple juice Coffee or tea                        Jell-O                                      Popsicle                                                Coffee or tea                        Coffee or tea  _____________________________________________________________________   BRUSH YOUR TEETH MORNING OF SURGERY AND RINSE YOUR MOUTH OUT, NO CHEWING GUM CANDY OR MINTS.     Take these medicines the morning of surgery with A SIP OF WATER: AMLODIPINE, TYLENOL IF NEEDED, EYE DROPS  DO NOT TAKE ANY DIABETIC MEDICATIONS DAY OF YOUR SURGERY                               You may not have any metal on your body including hair pins and              piercings  Do not wear jewelry, make-up, lotions, powders or perfumes, deodorant                          Men may shave face and neck.   Do not bring valuables to the hospital. [place].  Contacts, dentures or bridgework may not be worn into surgery.  YOU MAY BRING A SMALL OVERNIGHT BAG                   Please read over the following fact sheets you were given: _____________________________________________________________________             [facility] - Preparing for Surgery Before surgery, you can play an important role.  Because skin is not sterile, your skin needs to be as free of germs as possible.  You can reduce the number of germs on your skin by washing with CHG (chlorahexidine gluconate) soap before surgery.  CHG is an antiseptic cleaner which kills germs and bonds with the skin to continue killing germs even after washing. Please DO NOT use if you have an allergy to CHG or antibacterial soaps.  If your skin becomes reddened/irritated stop using the CHG and inform your nurse when you arrive at Short Stay. Do not shave (including legs and underarms) for at least 48  hours prior to the first CHG shower.  You may shave your face/neck. Please follow these instructions carefully:  1.   Shower with CHG Soap the night before surgery and the  morning of Surgery.  2.  If you choose to wash your hair, wash your hair first as usual with your  normal  shampoo.  3.  After you shampoo, rinse your hair and body thoroughly to remove the  shampoo.                           4.  Use CHG as you would any other liquid soap.  You can apply chg directly  to the skin and wash                       Gently with a scrungie or clean washcloth.  5.  Apply the CHG Soap to your body ONLY FROM THE NECK DOWN.   Do not use on face/ open                           Wound or open sores. Avoid contact with eyes, ears mouth and genitals (private parts).                       Wash face,  Genitals (private parts) with your normal soap.             6.  Wash thoroughly, paying special attention to the area where your surgery  will be performed.  7.  Thoroughly rinse your body with warm water from the neck down.  8.  DO NOT shower/wash with your normal soap after using and rinsing off  the CHG Soap.                9.  Pat yourself dry with a clean towel.            10.  Wear clean pajamas.            11.  Place clean sheets on your bed the night of your first shower and do not  sleep with pets. Day of Surgery : Do not apply any lotions/deodorants the morning of surgery.  Please wear clean clothes to the hospital/surgery center.  FAILURE TO FOLLOW THESE INSTRUCTIONS MAY RESULT IN THE CANCELLATION OF YOUR SURGERY PATIENT SIGNATURE_________________________________  NURSE SIGNATURE__________________________________  ________________________________________________________________________   [name]  An incentive spirometer is a tool that can help keep your lungs clear and active. This tool measures how well you are filling your lungs with each breath. Taking long deep breaths may help reverse or decrease the chance of developing breathing (pulmonary) problems (especially infection) following:  A long  period of time when you are unable to move or be active. BEFORE THE PROCEDURE   If the spirometer includes an indicator to show your best effort, your nurse or respiratory therapist will set it to a desired goal.  If possible, sit up straight or lean slightly forward. Try not to slouch.  Hold the incentive spirometer in an upright position. INSTRUCTIONS FOR USE  1. Sit on the edge of your bed if possible, or sit up as far as you can in bed or on a chair. 2. Hold the incentive spirometer in an upright position. 3. Breathe out normally. 4. Place the mouthpiece in your mouth and seal your lips tightly around it. 5.  Breathe in slowly and as deeply as possible, raising the piston or the ball toward the top of the column. 6. Hold your breath for 3-5 seconds or for as long as possible. Allow the piston or ball to fall to the bottom of the column. 7. Remove the mouthpiece from your mouth and breathe out normally. 8. Rest for a few seconds and repeat Steps 1 through 7 at least 10 times every 1-2 hours when you are awake. Take your time and take a few normal breaths between deep breaths. 9. The spirometer may include an indicator to show your best effort. Use the indicator as a goal to work toward during each repetition. 10. After each set of 10 deep breaths, practice coughing to be sure your lungs are clear. If you have an incision (the cut made at the time of surgery), support your incision when coughing by placing a pillow or rolled up towels firmly against it. Once you are able to get out of bed, walk around indoors and cough well. You may stop using the incentive spirometer when instructed by your caregiver.  RISKS AND COMPLICATIONS  Take your time so you do not get dizzy or light-headed.  If you are in pain, you may need to take or ask for pain medication before doing incentive spirometry. It is harder to take a deep breath if you are having pain. AFTER USE  Rest and breathe slowly and  easily.  It can be helpful to keep track of a log of your progress. Your caregiver can provide you with a simple table to help with this. If you are using the spirometer at home, follow these instructions: [place] IF:   You are having difficultly using the spirometer.  You have trouble using the spirometer as often as instructed.  Your pain medication is not giving enough relief while using the spirometer.  You develop fever of 100.5 F (38.1 C) or higher. SEEK IMMEDIATE MEDICAL CARE IF:   You cough up bloody sputum that had not been present before.  You develop fever of 102 F (38.9 C) or greater.  You develop worsening pain at or near the incision site. MAKE SURE YOU:   Understand these instructions.  Will watch your condition.  Will get help right away if you are not doing well or get worse. Document Released: 07/27/2006 Document Revised: 06/08/2011 Document Reviewed: 09/27/2006 Exit[employer] Patient Information 2014 [employer], [place].   ________________________________________________________________________  WHAT IS A BLOOD TRANSFUSION? Blood Transfusion Information  A transfusion is the replacement of blood or some of its parts. Blood is made up of multiple cells which provide different functions.  Red blood cells carry oxygen and are used for blood loss replacement.  White blood cells fight against infection.  Platelets control bleeding.  Plasma helps clot blood.  Other blood products are available for specialized needs, such as hemophilia or other clotting disorders. BEFORE THE TRANSFUSION  Who gives blood for transfusions?   Healthy volunteers who are fully evaluated to make sure their blood is safe. This is blood bank blood. Transfusion therapy is the safest it has ever been in the practice of medicine. Before blood is taken from a donor, a complete history is taken to make sure that person has no history of diseases nor engages in risky social  behavior (examples are intravenous drug use or sexual activity with multiple partners). The donor's travel history is screened to minimize risk of transmitting infections, such as malaria. The donated blood is  tested for signs of infectious diseases, such as HIV and hepatitis. The blood is then tested to be sure it is compatible with you in order to minimize the chance of a transfusion reaction. If you or a relative donates blood, this is often done in anticipation of surgery and is not appropriate for emergency situations. It takes many days to process the donated blood. RISKS AND COMPLICATIONS Although transfusion therapy is very safe and saves many lives, the main dangers of transfusion include:   Getting an infectious disease.  Developing a transfusion reaction. This is an allergic reaction to something in the blood you were given. Every precaution is taken to prevent this. The decision to have a blood transfusion has been considered carefully by your caregiver before blood is given. Blood is not given unless the benefits outweigh the risks. AFTER THE TRANSFUSION  Right after receiving a blood transfusion, you will usually feel much better and more energetic. This is especially true if your red blood cells have gotten low (anemic). The transfusion raises the level of the red blood cells which carry oxygen, and this usually causes an energy increase.  The nurse administering the transfusion will monitor you carefully for complications. HOME CARE INSTRUCTIONS  No special instructions are needed after a transfusion. You may find your energy is better. Speak with your caregiver about any limitations on activity for underlying diseases you may have. SEEK MEDICAL CARE IF:   Your condition is not improving after your transfusion.  You develop redness or irritation at the intravenous (IV) site. SEEK IMMEDIATE MEDICAL CARE IF:  Any of the following symptoms occur over the next 12 hours:  Shaking  chills.  You have a temperature by mouth above 102 F (38.9 C), not controlled by medicine.  Chest, back, or muscle pain.  People around you feel you are not acting correctly or are confused.  Shortness of breath or difficulty breathing.  Dizziness and fainting.  You get a rash or develop hives.  You have a decrease in urine output.  Your urine turns a dark color or changes to pink, red, or brown. Any of the following symptoms occur over the next 10 days:  You have a temperature by mouth above 102 F (38.9 C), not controlled by medicine.  Shortness of breath.  Weakness after normal activity.  The white part of the eye turns yellow (jaundice).  You have a decrease in the amount of urine or are urinating less often.  Your urine turns a dark color or changes to pink, red, or brown. Document Released: 03/13/2000 Document Revised: 06/08/2011 Document Reviewed: 10/31/2007 [facility] Patient Information 2014 [place], [place].  _______________________________________________________________________

## 2019-02-15 ENCOUNTER — Other Ambulatory Visit: Payer: Self-pay

## 2019-02-15 ENCOUNTER — Encounter (HOSPITAL_COMMUNITY)
Admission: RE | Admit: 2019-02-15 | Discharge: 2019-02-15 | Disposition: A | Discharge status: Home / Self Care | Payer: Medicare Other | Source: Ambulatory Visit | Attending: Orthopedic Surgery | Admitting: Orthopedic Surgery

## 2019-02-15 DIAGNOSIS — Z01818 Encounter for other preprocedural examination: Secondary | ICD-10-CM | POA: Diagnosis not present

## 2019-02-15 DIAGNOSIS — M1712 Unilateral primary osteoarthritis, left knee: Secondary | ICD-10-CM | POA: Insufficient documentation

## 2019-02-15 LAB — COMPREHENSIVE METABOLIC PANEL
ALT: 23 U/L (ref 0–44)
AST: 24 U/L (ref 15–41)
Albumin: 3.9 g/dL (ref 3.5–5.0)
Alkaline Phosphatase: 44 U/L (ref 38–126)
Anion gap: 9 (ref 5–15)
BUN: 15 mg/dL (ref 8–23)
CO2: 25 mmol/L (ref 22–32)
Calcium: 9.8 mg/dL (ref 8.9–10.3)
Chloride: 104 mmol/L (ref 98–111)
Creatinine, Ser: 0.72 mg/dL (ref 0.61–1.24)
GFR calc Af Amer: >60 mL/min (ref >60)
GFR calc non Af Amer: >60 mL/min (ref >60)
Glucose, Bld: 87 mg/dL (ref 70–99)
Potassium: 4.2 mmol/L (ref 3.5–5.1)
Sodium: 138 mmol/L (ref 135–145)
Total Bilirubin: 1 mg/dL (ref 0.3–1.2)
Total Protein: 6.7 g/dL (ref 6.5–8.1)

## 2019-02-15 LAB — CBC WITH DIFFERENTIAL/PLATELET
Abs Immature Granulocytes: 0.02 10*3/uL (ref 0.00–0.07)
Basophils Absolute: 0.1 10*3/uL (ref 0.0–0.1)
Basophils Relative: 1 %
Eosinophils Absolute: 0.1 10*3/uL (ref 0.0–0.5)
Eosinophils Relative: 2 %
HCT: 42.2 % (ref 39.0–52.0)
Hemoglobin: 14.4 g/dL (ref 13.0–17.0)
Immature Granulocytes: 0 %
Lymphocytes Relative: 32 %
Lymphs Abs: 2.2 10*3/uL (ref 0.7–4.0)
MCH: 34.5 pg — ABNORMAL HIGH (ref 26.0–34.0)
MCHC: 34.1 g/dL (ref 30.0–36.0)
MCV: 101.2 fL — ABNORMAL HIGH (ref 80.0–100.0)
Monocytes Absolute: 0.7 10*3/uL (ref 0.1–1.0)
Monocytes Relative: 10 %
Neutro Abs: 3.9 10*3/uL (ref 1.7–7.7)
Neutrophils Relative %: 55 %
Platelets: 190 10*3/uL (ref 150–400)
RBC: 4.17 MIL/uL — ABNORMAL LOW (ref 4.22–5.81)
RDW: 12.5 % (ref 11.5–15.5)
WBC: 7.1 10*3/uL (ref 4.0–10.5)
nRBC: 0 % (ref 0.0–0.2)

## 2019-02-15 LAB — PROTIME-INR
INR: 0.9 (ref 0.8–1.2)
Prothrombin Time: 12.4 seconds (ref 11.4–15.2)

## 2019-02-15 LAB — GLUCOSE, CAPILLARY: Glucose-Capillary: 87 mg/dL (ref 70–99)

## 2019-02-15 LAB — APTT: aPTT: 34 seconds (ref 24–36)

## 2019-02-15 LAB — SURGICAL PCR SCREEN
MRSA, PCR: NEGATIVE
Staphylococcus aureus: POSITIVE — AB

## 2019-02-15 LAB — ABO/RH: ABO/RH(D): O POS

## 2019-02-15 MED ORDER — CHLORHEXIDINE GLUCONATE 4 % EX LIQD
60.0000 mL | Freq: Once | CUTANEOUS | Status: DC
  Filled 2019-02-15: qty 60

## 2019-02-15 NOTE — Progress Notes (Signed)
PCP - Stoney Bang Cardiologist -n/a   Chest x-ray -  EKG - 02/15/2019 Stress Test -  ECHO -  Cardiac Cath -   Sleep Study - n/a CPAP - n/a  Fasting Blood Sugar - 87 Checks Blood Sugar _____ times a day  Blood Thinner Instructions: Aspirin Instructions: Stopped X 7 days. Last Dose:  Anesthesia review: No review needed.  Patient denies shortness of breath, fever, cough and chest pain at PAT appointment   Patient verbalized understanding of instructions that were given to them at the PAT appointment. Patient was also instructed that they will need to review over the PAT instructions again at home before surgery.

## 2019-02-15 NOTE — H&P (Signed)
TOTAL KNEE ADMISSION H&P  Patient is being admitted for left total knee arthroplasty.  Subjective:  Chief Complaint:left knee pain.  HPI: [name], 78 y.o. male, has a history of pain and functional disability in the left knee due to arthritis and has failed non-surgical conservative treatments for greater than 12 weeks to includeNSAID's and/or analgesics, corticosteriod injections, viscosupplementation injections and activity modification.  Onset of symptoms was gradual, starting >10 years ago with gradually worsening course since that time. The patient noted prior procedures on the knee to include  arthroscopy and menisectomy on the left knee(s).  Patient currently rates pain in the left knee(s) at 7 out of 10 with activity. Patient has night pain, worsening of pain with activity and weight bearing, pain that interferes with activities of daily living, pain with passive range of motion, crepitus and joint swelling.  Patient has evidence of periarticular osteophytes and joint space narrowing by imaging studies. There is no active infection.  Patient Active Problem List   Diagnosis Date Noted  . Spinal stenosis 11/23/2013  . Degenerative lumbar spinal stenosis 11/22/2013  . Multiple pulmonary nodules   . Hypercholesterolemia   . Elevated PSA   . Benign prostatic hypertrophy    Past Medical History:  Diagnosis Date  . Arthritis    lower back  . Benign prostatic hypertrophy   . Elevated PSA   . Hypercholesterolemia   . Hypertension   . Multiple pulmonary nodules     Past Surgical History:  Procedure Laterality Date  . APPENDECTOMY    . Appendectomy in 1972.    Marland Kitchen arthroscopic surgery to knee in past    . CATARACT EXTRACTION  2015  . cut top of right index finer requiring surgery 09/2014 and top of index finger now numb Right 10/01/2014  . ETHMOIDECTOMY Right 11/22/2017   Procedure: RIGHT TOTAL ETHMOIDECTOMY;  Surgeon: Leta Baptist, MD;  Location: [facility];   Service: ENT;  Laterality: Right;  . FRONTAL SINUS EXPLORATION Right 11/22/2017   Procedure: FRONTAL SINUS EXPLORATION;  Surgeon: Leta Baptist, MD;  Location: [facility];  Service: ENT;  Laterality: Right;  . LUMBAR LAMINECTOMY N/A 11/22/2013   Procedure: MICRODISCECTOMY LUMBAR LAMINECTOMY;  Surgeon: Marybelle Killings, MD;  Location: [facility];  Service: Orthopedics;  Laterality: N/A;  L4-5, L5-S1 Decompression  . MAXILLARY ANTROSTOMY Right 11/22/2017   Procedure: MAXILLARY ANTROSTOMY WITH TISSUE REMOVAL;  Surgeon: Leta Baptist, MD;  Location: [facility];  Service: ENT;  Laterality: Right;  . NASAL SEPTOPLASTY W/ TURBINOPLASTY Bilateral 11/22/2017   Procedure: NASAL SEPTOPLASTY WITH BILATERAL TURBINATE REDUCTION;  Surgeon: Leta Baptist, MD;  Location: [facility];  Service: ENT;  Laterality: Bilateral;  . Scrotal cyst removed in 2002.    Marland Kitchen SINUS ENDO WITH FUSION Bilateral 11/22/2017   Procedure: SINUS ENDOSCOPY WITH FUSION NAVIGATION;  Surgeon: Leta Baptist, MD;  Location: [facility];  Service: ENT;  Laterality: Bilateral;  . TONSILLECTOMY    . Vasectomy in 1977.      Current Outpatient Medications  Medication Sig Dispense Refill Last Dose  . acetaminophen (TYLENOL) 500 MG tablet Take 500-1,000 mg by mouth See admin instructions. Take 2 tablets (1000 mg) by mouth in the morning (scheduled), may take an additional dose in the evening if needed for pain.     Marland Kitchen amLODipine (NORVASC) 5 MG tablet Take 5 mg by mouth daily.     Marland Kitchen aspirin EC 81 MG tablet Take 81 mg by mouth  daily.     . atorvastatin (LIPITOR) 20 MG tablet Take 20 mg by mouth every evening.      . calcium carbonate (OS-CAL - DOSED IN MG OF ELEMENTAL CALCIUM) 1250 (500 Ca) MG tablet Take 500 mg by mouth daily.     . Glucos-Chond-Hyal Ac-Ca Fructo (MOVE FREE JOINT HEALTH ADVANCE PO) Take 1 tablet by mouth daily.     . Magnesium 250 MG TABS Take 250 mg by mouth daily.     . Multiple Vitamin (MULTIVITAMIN WITH  MINERALS) TABS tablet Take 1 tablet by mouth daily.     . Omega-3 Fatty Acids (FISH OIL) 1200 MG CAPS Take 1,200 mg by mouth daily.     Vladimir Faster Glycol-Propyl Glycol (LUBRICANT EYE DROPS) 0.4-0.3 % SOLN Place 1 drop into both eyes 3 (three) times daily as needed (dry/irritated eyes.).     Marland Kitchen tadalafil (CIALIS) 5 MG tablet Take 5 mg by mouth daily.      Facility-Administered Medications Ordered in Other Encounters  Medication Dose Route Frequency Provider Last Rate Last Dose  . chlorhexidine (HIBICLENS) 4 % liquid 4 application  60 mL Topical Once Kyshaun Barnette, [employer], PA-C      . chlorhexidine (HIBICLENS) 4 % liquid 4 application  60 mL Topical Once Cecilio Asper, [employer], PA-C       No Known Allergies  Social History   Tobacco Use  . Smoking status: Former Smoker    Types: Cigarettes    Quit date: 03/30/1982    Years since quitting: 36.9  . Smokeless tobacco: Never Used  Substance Use Topics  . Alcohol use: No     Review of Systems  Constitutional: Negative.   HENT: Negative.   Eyes: Negative.   Respiratory: Negative.   Cardiovascular: Negative.   Gastrointestinal: Negative.   Genitourinary: Negative.   Musculoskeletal: Positive for joint pain and myalgias. Negative for back pain, falls and neck pain.  Skin: Negative.   Neurological: Negative.   Endo/Heme/Allergies: Negative.   Psychiatric/Behavioral: Negative.     Objective:  Physical Exam  Musculoskeletal:     Comments: Left Knee Exam: No effusion. Significant varus deformity. Range of motion is 5-125 degrees. No crepitus on range of motion of the knee. Positive medial joint line tenderness No lateral joint line tenderness. Stable knee.  Neurological: He has normal strength. No sensory deficit.    Vital signs in last 24 hours: Temp:  [98.3 F (36.8 C)] 98.3 F (36.8 C) (11/18 1130) Pulse Rate:  [62] 62 (11/18 1130) Resp:  [16] 16 (11/18 1130) BP: (165)/(80) 165/80 (11/18 1130) SpO2:  [97 %] 97 % (11/18  1130) Weight:  [86.3 kg] 86.3 kg (11/18 1130)   Estimated body mass index is 26.54 kg/m as calculated from the following:   Height as of 02/15/19: 5\' 11"  (1.803 m).   Weight as of 02/15/19: 86.3 kg.   Imaging Review Plain radiographs demonstrate severe degenerative joint disease of the left knee(s). The overall alignment issignificant varus. The bone quality appears to be good for age and reported activity level.    Assessment/Plan:  End stage primary osteoarthritis, left knee   The patient history, physical examination, clinical judgment of the provider and imaging studies are consistent with end stage degenerative joint disease of the left knee(s) and total knee arthroplasty is deemed medically necessary. The treatment options including medical management, injection therapy arthroscopy and arthroplasty were discussed at length. The risks and benefits of total knee arthroplasty were presented and reviewed. The risks due to aseptic  loosening, infection, stiffness, patella tracking problems, thromboembolic complications and other imponderables were discussed. The patient acknowledged the explanation, agreed to proceed with the plan and consent was signed. Patient is being admitted for inpatient treatment for surgery, pain control, PT, OT, prophylactic antibiotics, VTE prophylaxis, progressive ambulation and ADL's and discharge planning. The patient is planning to be discharged home.    Anticipated LOS equal to or greater than 2 midnights due to - Age 73 and older with one or more of the following:  - Obesity  - Expected need for hospital services (PT, OT, Nursing) required for safe  discharge  - Anticipated need for postoperative skilled nursing care or inpatient rehab  - Active co-morbidities: None OR   - Unanticipated findings during/Post Surgery: None  - Patient is a high risk of re-admission due to: None    Risks and benefits of the surgery were discussed with the patient and  Dr.Aluisio at their previous office visit, and the patient has elected to move forward with the aforementioned surgery. Post-operative care plans were discussed with the patient today.  Therapy Plans: outpatient therapy at [facility] in [place] Disposition: Home with Planned DVT prophylaxis: aspirin 325mg  BID DME needed: has equipment PCP: Dr. Sherrie Sport- awaiting clearance Other: no anesthesia concerns  Instructed patient which meds to stop prior to surgery  Ardeen Jourdain, PA-C

## 2019-02-16 ENCOUNTER — Other Ambulatory Visit (HOSPITAL_COMMUNITY)
Admission: RE | Admit: 2019-02-16 | Discharge: 2019-02-16 | Disposition: A | Discharge status: Home / Self Care | Payer: Medicare Other | Source: Ambulatory Visit | Attending: Orthopedic Surgery | Admitting: Orthopedic Surgery

## 2019-02-16 DIAGNOSIS — Z01812 Encounter for preprocedural laboratory examination: Secondary | ICD-10-CM | POA: Diagnosis not present

## 2019-02-16 DIAGNOSIS — Z20828 Contact with and (suspected) exposure to other viral communicable diseases: Secondary | ICD-10-CM | POA: Diagnosis not present

## 2019-02-17 LAB — NOVEL CORONAVIRUS, NAA (HOSP ORDER, SEND-OUT TO REF LAB; TAT 18-24 HRS): SARS-CoV-2, NAA: NOT DETECTED

## 2019-02-19 MED ORDER — BUPIVACAINE LIPOSOME 1.3 % IJ SUSP
20.0000 mL | Freq: Once | INTRAMUSCULAR | Status: DC
  Filled 2019-02-19: qty 20

## 2019-02-20 ENCOUNTER — Encounter (HOSPITAL_COMMUNITY)
Admission: RE | Disposition: A | Discharge status: Home / Self Care | Payer: Self-pay | Source: Other Acute Inpatient Hospital | Attending: Orthopedic Surgery

## 2019-02-20 ENCOUNTER — Other Ambulatory Visit: Payer: Self-pay

## 2019-02-20 ENCOUNTER — Encounter (HOSPITAL_COMMUNITY): Payer: Self-pay | Admitting: *Deleted

## 2019-02-20 ENCOUNTER — Ambulatory Visit (HOSPITAL_COMMUNITY): Payer: Medicare Other | Admitting: Physician Assistant

## 2019-02-20 ENCOUNTER — Ambulatory Visit (HOSPITAL_COMMUNITY): Payer: Medicare Other | Admitting: Anesthesiology

## 2019-02-20 ENCOUNTER — Ambulatory Visit (HOSPITAL_COMMUNITY)
Admission: RE | Admit: 2019-02-20 | Discharge: 2019-02-21 | Disposition: A | Discharge status: Home / Self Care | Payer: Medicare Other | Source: Other Acute Inpatient Hospital | Attending: Orthopedic Surgery | Admitting: Orthopedic Surgery

## 2019-02-20 DIAGNOSIS — I10 Essential (primary) hypertension: Secondary | ICD-10-CM | POA: Diagnosis not present

## 2019-02-20 DIAGNOSIS — Z7982 Long term (current) use of aspirin: Secondary | ICD-10-CM | POA: Diagnosis not present

## 2019-02-20 DIAGNOSIS — E7800 Pure hypercholesterolemia, unspecified: Secondary | ICD-10-CM | POA: Diagnosis not present

## 2019-02-20 DIAGNOSIS — M1710 Unilateral primary osteoarthritis, unspecified knee: Secondary | ICD-10-CM | POA: Diagnosis present

## 2019-02-20 DIAGNOSIS — M25762 Osteophyte, left knee: Secondary | ICD-10-CM | POA: Diagnosis not present

## 2019-02-20 DIAGNOSIS — Z87891 Personal history of nicotine dependence: Secondary | ICD-10-CM | POA: Diagnosis not present

## 2019-02-20 DIAGNOSIS — M25562 Pain in left knee: Secondary | ICD-10-CM | POA: Diagnosis present

## 2019-02-20 DIAGNOSIS — M1712 Unilateral primary osteoarthritis, left knee: Secondary | ICD-10-CM | POA: Insufficient documentation

## 2019-02-20 DIAGNOSIS — Z79899 Other long term (current) drug therapy: Secondary | ICD-10-CM | POA: Insufficient documentation

## 2019-02-20 DIAGNOSIS — G8918 Other acute postprocedural pain: Secondary | ICD-10-CM | POA: Diagnosis not present

## 2019-02-20 DIAGNOSIS — N400 Enlarged prostate without lower urinary tract symptoms: Secondary | ICD-10-CM | POA: Diagnosis not present

## 2019-02-20 DIAGNOSIS — M179 Osteoarthritis of knee, unspecified: Secondary | ICD-10-CM | POA: Diagnosis present

## 2019-02-20 HISTORY — PX: TOTAL KNEE ARTHROPLASTY: SHX125

## 2019-02-20 LAB — TYPE AND SCREEN
ABO/RH(D): O POS
Antibody Screen: NEGATIVE

## 2019-02-20 SURGERY — ARTHROPLASTY, KNEE, TOTAL
Anesthesia: Spinal | Site: Knee | Laterality: Left

## 2019-02-20 MED ORDER — ROPIVACAINE HCL 7.5 MG/ML IJ SOLN
INTRAMUSCULAR | Status: DC | PRN
  Administered 2019-02-20: 20 mL via PERINEURAL

## 2019-02-20 MED ORDER — LIDOCAINE 2% (20 MG/ML) 5 ML SYRINGE
INTRAMUSCULAR | Status: AC
  Filled 2019-02-20: qty 5

## 2019-02-20 MED ORDER — BISACODYL 10 MG RE SUPP: 10.0000 mg | Freq: Every day | RECTAL | Status: DC | PRN

## 2019-02-20 MED ORDER — MORPHINE SULFATE (PF) 4 MG/ML IV SOLN: 1.0000 mg | INTRAVENOUS | Status: DC | PRN

## 2019-02-20 MED ORDER — POLYETHYLENE GLYCOL 3350 17 G PO PACK: 17.0000 g | PACK | Freq: Every day | ORAL | Status: DC | PRN

## 2019-02-20 MED ORDER — ONDANSETRON HCL 4 MG/2ML IJ SOLN: 4.0000 mg | Freq: Four times a day (QID) | INTRAMUSCULAR | Status: DC | PRN

## 2019-02-20 MED ORDER — ATORVASTATIN CALCIUM 20 MG PO TABS
20.0000 mg | ORAL_TABLET | Freq: Every evening | ORAL | Status: DC
  Administered 2019-02-20: 18:00:00 20 mg via ORAL
  Filled 2019-02-20: qty 1

## 2019-02-20 MED ORDER — SODIUM CHLORIDE (PF) 0.9 % IJ SOLN
INTRAMUSCULAR | Status: AC
  Filled 2019-02-20: qty 50

## 2019-02-20 MED ORDER — POVIDONE-IODINE 10 % EX SWAB
2.0000 "application " | Freq: Once | CUTANEOUS | Status: AC
  Administered 2019-02-20: 2 via TOPICAL

## 2019-02-20 MED ORDER — LIDOCAINE HCL (CARDIAC) PF 100 MG/5ML IV SOSY
PREFILLED_SYRINGE | INTRAVENOUS | Status: DC | PRN
  Administered 2019-02-20: 60 mg via INTRAVENOUS

## 2019-02-20 MED ORDER — DEXAMETHASONE SODIUM PHOSPHATE 10 MG/ML IJ SOLN
INTRAMUSCULAR | Status: AC
  Filled 2019-02-20: qty 1

## 2019-02-20 MED ORDER — ACETAMINOPHEN 500 MG PO TABS
1000.0000 mg | ORAL_TABLET | Freq: Four times a day (QID) | ORAL | Status: AC
  Administered 2019-02-20 – 2019-02-21 (×4): 1000 mg via ORAL
  Filled 2019-02-20 (×4): qty 2

## 2019-02-20 MED ORDER — SODIUM CHLORIDE (PF) 0.9 % IJ SOLN
INTRAMUSCULAR | Status: DC | PRN
  Administered 2019-02-20: 60 mL

## 2019-02-20 MED ORDER — METHOCARBAMOL 500 MG IVPB - SIMPLE MED
500.0000 mg | Freq: Four times a day (QID) | INTRAVENOUS | Status: DC | PRN
  Filled 2019-02-20: qty 50

## 2019-02-20 MED ORDER — BUPIVACAINE IN DEXTROSE 0.75-8.25 % IT SOLN
INTRATHECAL | Status: DC | PRN
  Administered 2019-02-20: 1.4 mL via INTRATHECAL

## 2019-02-20 MED ORDER — TRANEXAMIC ACID-NACL 1000-0.7 MG/100ML-% IV SOLN
1000.0000 mg | INTRAVENOUS | Status: AC
  Administered 2019-02-20: 10:00:00 1000 mg via INTRAVENOUS
  Filled 2019-02-20: qty 100

## 2019-02-20 MED ORDER — EPHEDRINE 5 MG/ML INJ
INTRAVENOUS | Status: AC
  Filled 2019-02-20: qty 10

## 2019-02-20 MED ORDER — SODIUM CHLORIDE 0.9 % IR SOLN
Status: DC | PRN
  Administered 2019-02-20: 1000 mL

## 2019-02-20 MED ORDER — ACETAMINOPHEN 500 MG PO TABS
1000.0000 mg | ORAL_TABLET | Freq: Once | ORAL | Status: AC
  Administered 2019-02-20: 1000 mg via ORAL
  Filled 2019-02-20: qty 2

## 2019-02-20 MED ORDER — BUPIVACAINE LIPOSOME 1.3 % IJ SUSP
INTRAMUSCULAR | Status: DC | PRN
  Administered 2019-02-20: 20 mL

## 2019-02-20 MED ORDER — ONDANSETRON HCL 4 MG/2ML IJ SOLN
INTRAMUSCULAR | Status: AC
  Filled 2019-02-20: qty 2

## 2019-02-20 MED ORDER — ACETAMINOPHEN 10 MG/ML IV SOLN
1000.0000 mg | Freq: Four times a day (QID) | INTRAVENOUS | Status: DC
  Filled 2019-02-20: qty 100

## 2019-02-20 MED ORDER — TRAMADOL HCL 50 MG PO TABS: 50.0000 mg | ORAL_TABLET | Freq: Four times a day (QID) | ORAL | Status: DC | PRN

## 2019-02-20 MED ORDER — OXYCODONE HCL 5 MG PO TABS
5.0000 mg | ORAL_TABLET | ORAL | Status: DC | PRN
  Administered 2019-02-20 – 2019-02-21 (×4): 5 mg via ORAL
  Filled 2019-02-20: qty 2
  Filled 2019-02-20 (×4): qty 1

## 2019-02-20 MED ORDER — ONDANSETRON HCL 4 MG PO TABS
4.0000 mg | ORAL_TABLET | Freq: Four times a day (QID) | ORAL | Status: DC | PRN
  Administered 2019-02-21: 15:00:00 4 mg via ORAL
  Filled 2019-02-20: qty 1

## 2019-02-20 MED ORDER — CELECOXIB 200 MG PO CAPS
400.0000 mg | ORAL_CAPSULE | Freq: Once | ORAL | Status: AC
  Administered 2019-02-20: 400 mg via ORAL
  Filled 2019-02-20: qty 2

## 2019-02-20 MED ORDER — DOCUSATE SODIUM 100 MG PO CAPS
100.0000 mg | ORAL_CAPSULE | Freq: Two times a day (BID) | ORAL | Status: DC
  Administered 2019-02-20 – 2019-02-21 (×2): 100 mg via ORAL
  Filled 2019-02-20 (×2): qty 1

## 2019-02-20 MED ORDER — METHOCARBAMOL 500 MG PO TABS: 500.0000 mg | ORAL_TABLET | Freq: Four times a day (QID) | ORAL | Status: DC | PRN

## 2019-02-20 MED ORDER — FENTANYL CITRATE (PF) 100 MCG/2ML IJ SOLN
50.0000 ug | Freq: Once | INTRAMUSCULAR | Status: AC
  Administered 2019-02-20: 100 ug via INTRAVENOUS
  Filled 2019-02-20: qty 2

## 2019-02-20 MED ORDER — CEFAZOLIN SODIUM-DEXTROSE 2-4 GM/100ML-% IV SOLN
2.0000 g | INTRAVENOUS | Status: AC
  Administered 2019-02-20: 10:00:00 2 g via INTRAVENOUS
  Filled 2019-02-20: qty 100

## 2019-02-20 MED ORDER — SODIUM CHLORIDE (PF) 0.9 % IJ SOLN
INTRAMUSCULAR | Status: AC
  Filled 2019-02-20: qty 10

## 2019-02-20 MED ORDER — ASPIRIN EC 325 MG PO TBEC
325.0000 mg | DELAYED_RELEASE_TABLET | Freq: Two times a day (BID) | ORAL | Status: DC
  Administered 2019-02-21: 09:00:00 325 mg via ORAL
  Filled 2019-02-20: qty 1

## 2019-02-20 MED ORDER — MIDAZOLAM HCL 2 MG/2ML IJ SOLN
1.0000 mg | Freq: Once | INTRAMUSCULAR | Status: AC
  Administered 2019-02-20: 09:00:00 1 mg via INTRAVENOUS
  Filled 2019-02-20: qty 2

## 2019-02-20 MED ORDER — ONDANSETRON HCL 4 MG/2ML IJ SOLN
INTRAMUSCULAR | Status: DC | PRN
  Administered 2019-02-20: 4 mg via INTRAVENOUS

## 2019-02-20 MED ORDER — MENTHOL 3 MG MT LOZG: 1.0000 | LOZENGE | OROMUCOSAL | Status: DC | PRN

## 2019-02-20 MED ORDER — LACTATED RINGERS IV SOLN
INTRAVENOUS | Status: DC
  Administered 2019-02-20 (×2): via INTRAVENOUS

## 2019-02-20 MED ORDER — DIPHENHYDRAMINE HCL 12.5 MG/5ML PO ELIX: 12.5000 mg | ORAL_SOLUTION | ORAL | Status: DC | PRN

## 2019-02-20 MED ORDER — DEXAMETHASONE SODIUM PHOSPHATE 10 MG/ML IJ SOLN: 8.0000 mg | Freq: Once | INTRAMUSCULAR | Status: DC

## 2019-02-20 MED ORDER — GABAPENTIN 100 MG PO CAPS
100.0000 mg | ORAL_CAPSULE | Freq: Three times a day (TID) | ORAL | Status: DC
  Administered 2019-02-20 – 2019-02-21 (×4): 100 mg via ORAL
  Filled 2019-02-20 (×4): qty 1

## 2019-02-20 MED ORDER — PHENYLEPHRINE 40 MCG/ML (10ML) SYRINGE FOR IV PUSH (FOR BLOOD PRESSURE SUPPORT)
PREFILLED_SYRINGE | INTRAVENOUS | Status: DC | PRN
  Administered 2019-02-20 (×3): 80 ug via INTRAVENOUS

## 2019-02-20 MED ORDER — PROPOFOL 500 MG/50ML IV EMUL
INTRAVENOUS | Status: DC | PRN
  Administered 2019-02-20: 75 ug/kg/min via INTRAVENOUS

## 2019-02-20 MED ORDER — PHENOL 1.4 % MT LIQD: 1.0000 | OROMUCOSAL | Status: DC | PRN

## 2019-02-20 MED ORDER — FENTANYL CITRATE (PF) 100 MCG/2ML IJ SOLN: 25.0000 ug | INTRAMUSCULAR | Status: DC | PRN

## 2019-02-20 MED ORDER — EPHEDRINE SULFATE-NACL 50-0.9 MG/10ML-% IV SOSY
PREFILLED_SYRINGE | INTRAVENOUS | Status: DC | PRN
  Administered 2019-02-20: 10 mg via INTRAVENOUS
  Administered 2019-02-20: 5 mg via INTRAVENOUS
  Administered 2019-02-20: 10 mg via INTRAVENOUS

## 2019-02-20 MED ORDER — METOCLOPRAMIDE HCL 5 MG/ML IJ SOLN: 5.0000 mg | Freq: Three times a day (TID) | INTRAMUSCULAR | Status: DC | PRN

## 2019-02-20 MED ORDER — PROMETHAZINE HCL 25 MG/ML IJ SOLN: 6.2500 mg | INTRAMUSCULAR | Status: DC | PRN

## 2019-02-20 MED ORDER — PHENYLEPHRINE 40 MCG/ML (10ML) SYRINGE FOR IV PUSH (FOR BLOOD PRESSURE SUPPORT)
PREFILLED_SYRINGE | INTRAVENOUS | Status: AC
  Filled 2019-02-20: qty 10

## 2019-02-20 MED ORDER — DEXAMETHASONE SODIUM PHOSPHATE 10 MG/ML IJ SOLN
INTRAMUSCULAR | Status: DC | PRN
  Administered 2019-02-20: 4 mg via INTRAVENOUS

## 2019-02-20 MED ORDER — SODIUM CHLORIDE 0.9 % IV SOLN
INTRAVENOUS | Status: DC
  Administered 2019-02-21: 03:00:00 via INTRAVENOUS

## 2019-02-20 MED ORDER — DEXAMETHASONE SODIUM PHOSPHATE 10 MG/ML IJ SOLN
10.0000 mg | Freq: Once | INTRAMUSCULAR | Status: AC
  Administered 2019-02-21: 10 mg via INTRAVENOUS
  Filled 2019-02-20: qty 1

## 2019-02-20 MED ORDER — FLEET ENEMA 7-19 GM/118ML RE ENEM: 1.0000 | ENEMA | Freq: Once | RECTAL | Status: DC | PRN

## 2019-02-20 MED ORDER — AMLODIPINE BESYLATE 5 MG PO TABS
5.0000 mg | ORAL_TABLET | Freq: Every day | ORAL | Status: DC
  Administered 2019-02-21: 5 mg via ORAL
  Filled 2019-02-20: qty 1

## 2019-02-20 MED ORDER — METOCLOPRAMIDE HCL 5 MG PO TABS: 5.0000 mg | ORAL_TABLET | Freq: Three times a day (TID) | ORAL | Status: DC | PRN

## 2019-02-20 MED ORDER — PROPOFOL 10 MG/ML IV BOLUS
INTRAVENOUS | Status: DC | PRN
  Administered 2019-02-20: 10 mg via INTRAVENOUS
  Administered 2019-02-20: 20 mg via INTRAVENOUS

## 2019-02-20 MED ORDER — CEFAZOLIN SODIUM-DEXTROSE 2-4 GM/100ML-% IV SOLN
2.0000 g | Freq: Four times a day (QID) | INTRAVENOUS | Status: AC
  Administered 2019-02-20 (×2): 2 g via INTRAVENOUS
  Filled 2019-02-20 (×2): qty 100

## 2019-02-20 SURGICAL SUPPLY — 58 items
BAG ZIPLOCK 12X15 (MISCELLANEOUS) ×3 IMPLANT
BLADE SAG 18X100X1.27 (BLADE) ×3 IMPLANT
BLADE SAW SGTL 11.0X1.19X90.0M (BLADE) ×3 IMPLANT
BLADE SURG SZ10 CARB STEEL (BLADE) ×6 IMPLANT
BNDG ELASTIC 6X5.8 VLCR STR LF (GAUZE/BANDAGES/DRESSINGS) ×3 IMPLANT
BOWL SMART MIX CTS (DISPOSABLE) ×3 IMPLANT
CEMENT HV SMART SET (Cement) ×6 IMPLANT
CEMENT TIBIA MBT SIZE 4 (Knees) IMPLANT
CLOSURE WOUND 1/2 X4 (GAUZE/BANDAGES/DRESSINGS) ×1
COVER SURGICAL LIGHT HANDLE (MISCELLANEOUS) ×3 IMPLANT
COVER WAND RF STERILE (DRAPES) IMPLANT
CUFF TOURN SGL QUICK 34 (TOURNIQUET CUFF) ×2
CUFF TRNQT CYL 34X4.125X (TOURNIQUET CUFF) ×1 IMPLANT
DECANTER SPIKE VIAL GLASS SM (MISCELLANEOUS) ×3 IMPLANT
DRAPE U-SHAPE 47X51 STRL (DRAPES) ×3 IMPLANT
DRSG ADAPTIC 3X8 NADH LF (GAUZE/BANDAGES/DRESSINGS) ×3 IMPLANT
DRSG PAD ABDOMINAL 8X10 ST (GAUZE/BANDAGES/DRESSINGS) ×3 IMPLANT
DURAPREP 26ML APPLICATOR (WOUND CARE) ×3 IMPLANT
ELECT REM PT RETURN 15FT ADLT (MISCELLANEOUS) ×3 IMPLANT
EVACUATOR 1/8 PVC DRAIN (DRAIN) ×3 IMPLANT
FEMUR SIGMA PS SZ 4.0 L (Knees) ×2 IMPLANT
GAUZE SPONGE 4X4 12PLY STRL (GAUZE/BANDAGES/DRESSINGS) ×3 IMPLANT
GLOVE BIO SURGEON STRL SZ7 (GLOVE) ×3 IMPLANT
GLOVE BIO SURGEON STRL SZ8 (GLOVE) ×3 IMPLANT
GLOVE BIOGEL PI IND STRL 6.5 (GLOVE) ×1 IMPLANT
GLOVE BIOGEL PI IND STRL 7.0 (GLOVE) ×1 IMPLANT
GLOVE BIOGEL PI IND STRL 8 (GLOVE) ×1 IMPLANT
GLOVE BIOGEL PI INDICATOR 6.5 (GLOVE) ×2
GLOVE BIOGEL PI INDICATOR 7.0 (GLOVE) ×2
GLOVE BIOGEL PI INDICATOR 8 (GLOVE) ×2
GLOVE SURG SS PI 6.5 STRL IVOR (GLOVE) ×3 IMPLANT
GOWN STRL REUS W/TWL LRG LVL3 (GOWN DISPOSABLE) ×9 IMPLANT
HANDPIECE INTERPULSE COAX TIP (DISPOSABLE) ×2
HOLDER FOLEY CATH W/STRAP (MISCELLANEOUS) IMPLANT
IMMOBILIZER KNEE 20 (SOFTGOODS) ×3
IMMOBILIZER KNEE 20 THIGH 36 (SOFTGOODS) ×1 IMPLANT
KIT TURNOVER KIT A (KITS) IMPLANT
MANIFOLD NEPTUNE II (INSTRUMENTS) ×3 IMPLANT
NS IRRIG 1000ML POUR BTL (IV SOLUTION) ×3 IMPLANT
PACK TOTAL KNEE CUSTOM (KITS) ×3 IMPLANT
PADDING CAST COTTON 6X4 STRL (CAST SUPPLIES) ×5 IMPLANT
PATELLA DOME PFC 38MM (Knees) ×2 IMPLANT
PENCIL SMOKE EVACUATOR (MISCELLANEOUS) IMPLANT
PIN STEINMAN FIXATION KNEE (PIN) ×2 IMPLANT
PLATE ROT INSERT 12.5MM SIZE 4 (Plate) ×2 IMPLANT
PROTECTOR NERVE ULNAR (MISCELLANEOUS) ×3 IMPLANT
SET HNDPC FAN SPRY TIP SCT (DISPOSABLE) ×1 IMPLANT
STRIP CLOSURE SKIN 1/2X4 (GAUZE/BANDAGES/DRESSINGS) ×3 IMPLANT
SUT MNCRL AB 4-0 PS2 18 (SUTURE) ×3 IMPLANT
SUT STRATAFIX 0 PDS 27 VIOLET (SUTURE) ×3
SUT VIC AB 2-0 CT1 27 (SUTURE) ×6
SUT VIC AB 2-0 CT1 TAPERPNT 27 (SUTURE) ×3 IMPLANT
SUTURE STRATFX 0 PDS 27 VIOLET (SUTURE) ×1 IMPLANT
TIBIA MBT CEMENT SIZE 4 (Knees) ×3 IMPLANT
TRAY FOLEY MTR SLVR 16FR STAT (SET/KITS/TRAYS/PACK) ×3 IMPLANT
WATER STERILE IRR 1000ML POUR (IV SOLUTION) ×6 IMPLANT
WRAP KNEE MAXI GEL POST OP (GAUZE/BANDAGES/DRESSINGS) ×3 IMPLANT
YANKAUER SUCT BULB TIP 10FT TU (MISCELLANEOUS) ×3 IMPLANT

## 2019-02-20 NOTE — Plan of Care (Signed)

## 2019-02-20 NOTE — Evaluation (Signed)
Physical Therapy Evaluation Patient Details Name: [name] MRN: [id number] DOB: [birth year] Today's Date: 02/20/2019   History of Present Illness  Patient is 78 y.o. male s/p Lt TKA on 02/20/19 with PMH significan for HTN, OA, and lumbar laminectomy L4-S1.  Clinical Impression  [name] is a 78 y.o. male POD 0 s/p Lt TKA. Patient reports independence with mobility at baseline. Patient is now limited by functional impairments (see PT problem list below) and requires min assist/guard for transfers and gait with RW. Patient was able to ambulate ~80 feet with RW and min guard/assist, no overt LOB throughout. Patient instructed in exercise to facilitate ROM and circulation. Patient will benefit from continued skilled PT interventions to address impairments and progress towards PLOF. Acute PT will follow to progress mobility and stair training in preparation for safe discharge home.    Follow Up Recommendations Follow surgeon's recommendation for DC plan and follow-up therapies    Equipment Recommendations  Other (comment)(checking RW height that is available at home, spouse will bring it in POD1 in AM to adjust height)    Recommendations for Other Services       Precautions / Restrictions Precautions Precautions: Fall Restrictions Weight Bearing Restrictions: No      Mobility  Bed Mobility Overal bed mobility: Needs Assistance Bed Mobility: Supine to Sit     Supine to sit: Supervision;HOB elevated     General bed mobility comments: pt using bed rails, no assist required for mobility, cues to scoot to EOB  Transfers Overall transfer level: Needs assistance Equipment used: Rolling walker (2 wheeled) Transfers: Sit to/from Stand Sit to Stand: Min assist         General transfer comment: cues or safe hand placement and technique with RW, assist to initiate power up  Ambulation/Gait Ambulation/Gait assistance: Min assist;Min guard Gait Distance (Feet): 80  Feet Assistive device: Rolling walker (2 wheeled) Gait Pattern/deviations: Step-through pattern;Decreased stance time - left;Decreased stride length Gait velocity: decreased   General Gait Details: pt hesitant to WB on Lt LE initially and improved throughout, cues and assist at start requried to maintain safe proximity to RW, pt improved throughout and progressed to min guard assist. no overt LOB noted  Stairs            Wheelchair Mobility    Modified Rankin (Stroke Patients Only)       Balance Overall balance assessment: Needs assistance Sitting-balance support: Feet supported Sitting balance-Leahy Scale: Good     Standing balance support: Bilateral upper extremity supported;During functional activity Standing balance-Leahy Scale: Fair          Pertinent Vitals/Pain Pain Assessment: Faces Faces Pain Scale: Hurts little more Pain Location: Lt knee Pain Descriptors / Indicators: Guarding;Sore Pain Intervention(s): Limited activity within patient's tolerance;Monitored during session;Repositioned;Ice applied    Home Living Family/patient expects to be discharged to:: Private residence Living Arrangements: Spouse/significant other Available Help at Discharge: Family;Available 24 hours/day(pt's son is close by and can assist as well) Type of Home: House Home Access: Stairs to enter Entrance Stairs-Rails: None Entrance Stairs-Number of Steps: 1 Home Layout: One level Home Equipment: Bedside commode;Walker - 2 wheels;Cane - single point Additional Comments: walker belongs to pt's wife, she is 5'3" (instructed to bring walker in to adjust it and make sure the height will work)    Prior Function Level of Independence: Independent         Comments: pt still works and mobilizes with no device, 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     Hand Dominance   Dominant Hand: Right    Extremity/Trunk Assessment   Upper Extremity Assessment Upper Extremity Assessment:  Overall WFL for tasks assessed    Lower Extremity Assessment Lower Extremity Assessment: LLE deficits/detail LLE Deficits / Details: pt with good quad activation, no extensor lag with SLR, 4/5 for quad strength with MMT LLE Sensation: WNL LLE Coordination: WNL    Cervical / Trunk Assessment Cervical / Trunk Assessment: Normal  Communication   Communication: HOH  Cognition Arousal/Alertness: Awake/alert Behavior During Therapy: WFL for tasks assessed/performed Overall Cognitive Status: Within Functional Limits for tasks assessed         General Comments      Exercises Total Joint Exercises Ankle Circles/Pumps: AROM;Both;15 reps;Seated Quad Sets: AROM;Seated;Left;10 reps Heel Slides: AAROM;Seated;Left;10 reps   Assessment/Plan    PT Assessment Patient needs continued PT services  PT Problem List Decreased strength;Decreased mobility;Decreased range of motion;Decreased activity tolerance;Decreased balance;Decreased knowledge of use of DME       PT Treatment Interventions DME instruction;Therapeutic activities;Gait training;Therapeutic exercise;Balance training;Stair training;Functional mobility training    PT Goals (Current goals can be found in the Care Plan section)  Acute Rehab PT Goals Patient Stated Goal: to get ack home and get walking independently again PT Goal Formulation: With patient Time For Goal Achievement: 02/27/19 Potential to Achieve Goals: Good    Frequency 7X/week    AM-PAC PT "6 Clicks" Mobility  Outcome Measure Help needed turning from your back to your side while in a flat bed without using bedrails?: A Little Help needed moving from lying on your back to sitting on the side of a flat bed without using bedrails?: A Little Help needed moving to and from a bed to a chair (including a wheelchair)?: A Little Help needed standing up from a chair using your arms (e.g., wheelchair or bedside chair)?: A Little Help needed to walk in hospital room?: A  Little Help needed climbing 3-5 steps with a railing? : A Little 6 Click Score: 18    End of Session Equipment Utilized During Treatment: Gait belt Activity Tolerance: Patient tolerated treatment well Patient left: in chair;with chair alarm set;with family/visitor present Nurse Communication: Mobility status PT Visit Diagnosis: Muscle weakness (generalized) (M62.81);Difficulty in walking, not elsewhere classified (R26.2)    Time: [id number] PT Time Calculation (min) (ACUTE ONLY): 23 min   Charges:   PT Evaluation $PT Eval Low Complexity: 1 Low PT Treatments $Therapeutic Exercise: 8-22 mins       Kipp Brood, PT, DPT Physical Therapist with [facility]  02/20/2019 5:26 PM

## 2019-02-20 NOTE — Interval H&P Note (Signed)
History and Physical Interval Note:  02/20/2019 6:56 AM  [name]  has presented today for surgery, with the diagnosis of left knee osteoarthritis.  The various methods of treatment have been discussed with the patient and family. After consideration of risks, benefits and other options for treatment, the patient has consented to  Procedure(s) with comments: TOTAL KNEE ARTHROPLASTY (Left) - 80min as a surgical intervention.  The patient's history has been reviewed, patient examined, no change in status, stable for surgery.  I have reviewed the patient's chart and labs.  Questions were answered to the patient's satisfaction.     Pilar Plate Dove [place]

## 2019-02-20 NOTE — Progress Notes (Signed)
Assisted Dr. Singer with left, ultrasound guided, adductor canal block. Side rails up, monitors on throughout procedure. See vital signs in flow sheet. Tolerated Procedure well.  

## 2019-02-20 NOTE — Care Plan (Signed)
Ortho Bundle Case Management Note  Patient Details  Name: [name] MRN: [id number] Date of Birth: 11-08-1940  L TKA on 02-20-19 DCP:  Home with spouse.  1 story home with 1 ste. DME:  No needs.  Has a RW and 3-in-1. PT:  [facility].  PT eval scheduled on 02-24-19.                   DME Arranged:  N/A DME Agency:  NA  HH Arranged:  NA HH Agency:  NA  Additional Comments: Please contact me with any questions of if this plan should need to change.  Marianne Sofia, RN,CCM [facility]  [phone] 02/20/2019, 2:14 PM

## 2019-02-20 NOTE — Anesthesia Preprocedure Evaluation (Addendum)
Anesthesia Evaluation    Reviewed: Allergy & Precautions, H&P , Patient's Chart, lab work & pertinent test results  History of Anesthesia Complications Negative for: history of anesthetic complications  Airway Mallampati: II  TM Distance: >3 FB Neck ROM: Full    Dental no notable dental hx. (+) Dental Advisory Given, Teeth Intact   Pulmonary neg pulmonary ROS, former smoker,    Pulmonary exam normal breath sounds clear to auscultation       Cardiovascular hypertension, Pt. on medications negative cardio ROS Normal cardiovascular exam Rhythm:Regular Rate:Normal     Neuro/Psych negative neurological ROS  negative psych ROS   GI/Hepatic negative GI ROS, Neg liver ROS,   Endo/Other  negative endocrine ROS  Renal/GU negative Renal ROS  negative genitourinary   Musculoskeletal  (+) Arthritis ,   Abdominal   Peds negative pediatric ROS (+)  Hematology negative hematology ROS (+)   Anesthesia Other Findings   Reproductive/Obstetrics negative OB ROS                            Anesthesia Physical  Anesthesia Plan  ASA: II  Anesthesia Plan: Spinal   Post-op Pain Management:  Regional for Post-op pain   Induction:   PONV Risk Score and Plan: 2 and Dexamethasone, Treatment may vary due to age or medical condition, Ondansetron and Propofol infusion  Airway Management Planned: Natural Airway  Additional Equipment:   Intra-op Plan:   Post-operative Plan:   Informed Consent: I have reviewed the patients History and Physical, chart, labs and discussed the procedure including the risks, benefits and alternatives for the proposed anesthesia with the patient or authorized representative who has indicated his/her understanding and acceptance.     Dental advisory given  Plan Discussed with: Anesthesiologist  Anesthesia Plan Comments:        Anesthesia Quick Evaluation

## 2019-02-20 NOTE — Anesthesia Procedure Notes (Signed)
Anesthesia Regional Block: Adductor canal block   Pre-Anesthetic Checklist: ,, timeout performed, Correct Patient, Correct Site, Correct Laterality, Correct Procedure, Correct Position, site marked, Risks and benefits discussed,  Surgical consent,  Pre-op evaluation,  At surgeon's request and post-op pain management  Laterality: Left  Prep: chloraprep       Needles:  Injection technique: Single-shot  Needle Type: Stimulator Needle - 80     Needle Length: 10cm  Needle Gauge: 21     Additional Needles:   Narrative:  Start time: 02/20/2019 8:31 AM End time: 02/20/2019 8:41 AM Injection made incrementally with aspirations every 5 mL.  Performed by: Personally

## 2019-02-20 NOTE — Anesthesia Postprocedure Evaluation (Signed)
Anesthesia Post Note  Patient: [name]  Procedure(s) Performed: TOTAL KNEE ARTHROPLASTY (Left Knee)     Patient location during evaluation: PACU Anesthesia Type: Spinal Level of consciousness: awake and alert Pain management: pain level controlled Vital Signs Assessment: post-procedure vital signs reviewed and stable Respiratory status: spontaneous breathing and respiratory function stable Cardiovascular status: blood pressure returned to baseline and stable Postop Assessment: spinal receding Anesthetic complications: no    Last Vitals:  Vitals:   02/20/19 1215 02/20/19 1255  BP: 116/71 123/75  Pulse: 60 65  Resp: 16 16  Temp:  36.6 C  SpO2: 98% 97%    Last Pain:  Vitals:   02/20/19 1255  TempSrc: Oral  PainSc:                  [name]

## 2019-02-20 NOTE — Transfer of Care (Signed)
Immediate Anesthesia Transfer of Care Note  Patient: [name]  Procedure(s) Performed: TOTAL KNEE ARTHROPLASTY (Left Knee)  Patient Location: PACU  Anesthesia Type:Spinal  Level of Consciousness: awake and patient cooperative  Airway & Oxygen Therapy: Patient Spontanous Breathing and Patient connected to face mask oxygen  Post-op Assessment: Report given to RN and Post -op Vital signs reviewed and stable  Post vital signs: Reviewed and stable  Last Vitals:  Vitals Value Taken Time  BP 110/70 02/20/19 1100  Temp    Pulse 73 02/20/19 1101  Resp 18 02/20/19 1101  SpO2 100 % 02/20/19 1101  Vitals shown include unvalidated device data.  Last Pain:  Vitals:   02/20/19 0549  TempSrc: Oral         Complications: No apparent anesthesia complications

## 2019-02-20 NOTE — Anesthesia Procedure Notes (Signed)
Spinal  Patient location during procedure: OR End time: 02/20/2019 9:30 AM Staffing Resident/CRNA: Caryl Pina T, CRNA Performed: resident/CRNA  Preanesthetic Checklist Completed: patient identified, site marked, surgical consent, pre-op evaluation, timeout performed, IV checked, risks and benefits discussed and monitors and equipment checked Spinal Block Patient position: sitting Prep: DuraPrep Patient monitoring: heart rate, cardiac monitor, continuous pulse ox and blood pressure Approach: midline Location: L3-4 Injection technique: single-shot Needle Needle type: Pencan  Needle gauge: 24 G Needle length: 9 cm Assessment Sensory level: T4 Additional Notes Expiration date of kit checked and confirmed. Patient tolerated procedure well, without complications.

## 2019-02-20 NOTE — Op Note (Signed)
OPERATIVE REPORT-TOTAL KNEE ARTHROPLASTY   Pre-operative diagnosis- Osteoarthritis  Left knee(s)  Post-operative diagnosis- Osteoarthritis Left knee(s)  Procedure-  Left  Total Knee Arthroplasty  Surgeon- Dione Plover. Adarsh Mundorf, MD  Assistant- Theresa Duty, PA-C  Anesthesia-  Adductor canal block and spinal   EBL-25 mL   Drains Hemovac  Tourniquet time-  Total Tourniquet Time Documented: Thigh (Left) - 40 minutes Total: Thigh (Left) - 40 minutes     Complications- None  Condition-PACU - hemodynamically stable.   Brief Clinical Note   [name] is a 78 y.o. year old male with end stage OA of his left knee with progressively worsening pain and dysfunction. He has constant pain, with activity and at rest and significant functional deficits with difficulties even with ADLs. He has had extensive non-op management including analgesics, injections of cortisone and viscosupplements, and home exercise program, but remains in significant pain with significant dysfunction. Radiographs show bone on bone arthritis medial and patellofemoral. He presents now for left Total Knee Arthroplasty.    Procedure in detail---   The patient is brought into the operating room and positioned supine on the operating table. After successful administration of  Adductor canal block and spinal ,   a tourniquet is placed high on the  Left thigh(s) and the lower extremity is prepped and draped in the usual sterile fashion. Time out is performed by the operating team and then the  Left lower extremity is wrapped in Esmarch, knee flexed and the tourniquet inflated to 300 mmHg.       A midline incision is made with a ten blade through the subcutaneous tissue to the level of the extensor mechanism. A fresh blade is used to make a medial parapatellar arthrotomy. Soft tissue over the proximal medial tibia is subperiosteally elevated to the joint line with a knife and into the semimembranosus bursa with a Cobb  elevator. Soft tissue over the proximal lateral tibia is elevated with attention being paid to avoiding the patellar tendon on the tibial tubercle. The patella is everted, knee flexed 90 degrees and the ACL and PCL are removed. Findings are bone on bone medial and patelloefmoral with large global osteophytes        The drill is used to create a starting hole in the distal femur and the canal is thoroughly irrigated with sterile saline to remove the fatty contents. The 5 degree Left  valgus alignment guide is placed into the femoral canal and the distal femoral cutting block is pinned to remove 10 mm off the distal femur. Resection is made with an oscillating saw.      The tibia is subluxed forward and the menisci are removed. The extramedullary alignment guide is placed referencing proximally at the medial aspect of the tibial tubercle and distally along the second metatarsal axis and tibial crest. The block is pinned to remove 84mm off the more deficient medial  side. Resection is made with an oscillating saw. Size 4is the most appropriate size for the tibia and the proximal tibia is prepared with the modular drill and keel punch for that size.      The femoral sizing guide is placed and size 4 is most appropriate. Rotation is marked off the epicondylar axis and confirmed by creating a rectangular flexion gap at 90 degrees. The size 4 cutting block is pinned in this rotation and the anterior, posterior and chamfer cuts are made with the oscillating saw. The intercondylar block is then placed and that cut is  made.      Trial size 4 tibial component, trial size 4 posterior stabilized femur and a 12.5  mm posterior stabilized rotating platform insert trial is placed. Full extension is achieved with excellent varus/valgus and anterior/posterior balance throughout full range of motion. The patella is everted and thickness measured to be 24  mm. Free hand resection is taken to 14 mm, a 38 template is placed, lug holes  are drilled, trial patella is placed, and it tracks normally. Osteophytes are removed off the posterior femur with the trial in place. All trials are removed and the cut bone surfaces prepared with pulsatile lavage. Cement is mixed and once ready for implantation, the size 4 tibial implant, size  4 posterior stabilized femoral component, and the size 38 patella are cemented in place and the patella is held with the clamp. The trial insert is placed and the knee held in full extension. The Exparel (20 ml mixed with 60 ml saline) is injected into the extensor mechanism, posterior capsule, medial and lateral gutters and subcutaneous tissues.  All extruded cement is removed and once the cement is hard the permanent 12.5 mm posterior stabilized rotating platform insert is placed into the tibial tray.      The wound is copiously irrigated with saline solution and the extensor mechanism closed over a hemovac drain with #1 V-loc suture. The tourniquet is released for a total tourniquet time of 40  minutes. Flexion against gravity is 140 degrees and the patella tracks normally. Subcutaneous tissue is closed with 2.0 vicryl and subcuticular with running 4.0 Monocryl. The incision is cleaned and dried and steri-strips and a bulky sterile dressing are applied. The limb is placed into a knee immobilizer and the patient is awakened and transported to recovery in stable condition.      Please note that a surgical assistant was a medical necessity for this procedure in order to perform it in a safe and expeditious manner. Surgical assistant was necessary to retract the ligaments and vital neurovascular structures to prevent injury to them and also necessary for proper positioning of the limb to allow for anatomic placement of the prosthesis.   Dione Plover Robb Sibal, MD    02/20/2019, 10:44 AM

## 2019-02-21 ENCOUNTER — Encounter (HOSPITAL_COMMUNITY): Payer: Self-pay | Admitting: Orthopedic Surgery

## 2019-02-21 DIAGNOSIS — E7800 Pure hypercholesterolemia, unspecified: Secondary | ICD-10-CM | POA: Diagnosis not present

## 2019-02-21 DIAGNOSIS — Z7982 Long term (current) use of aspirin: Secondary | ICD-10-CM | POA: Diagnosis not present

## 2019-02-21 DIAGNOSIS — M25762 Osteophyte, left knee: Secondary | ICD-10-CM | POA: Diagnosis not present

## 2019-02-21 DIAGNOSIS — I10 Essential (primary) hypertension: Secondary | ICD-10-CM | POA: Diagnosis not present

## 2019-02-21 DIAGNOSIS — Z79899 Other long term (current) drug therapy: Secondary | ICD-10-CM | POA: Diagnosis not present

## 2019-02-21 DIAGNOSIS — M1712 Unilateral primary osteoarthritis, left knee: Secondary | ICD-10-CM | POA: Diagnosis not present

## 2019-02-21 DIAGNOSIS — Z87891 Personal history of nicotine dependence: Secondary | ICD-10-CM | POA: Diagnosis not present

## 2019-02-21 DIAGNOSIS — M25562 Pain in left knee: Secondary | ICD-10-CM | POA: Diagnosis not present

## 2019-02-21 LAB — BASIC METABOLIC PANEL
Anion gap: 7 (ref 5–15)
BUN: 14 mg/dL (ref 8–23)
CO2: 22 mmol/L (ref 22–32)
Calcium: 8.3 mg/dL — ABNORMAL LOW (ref 8.9–10.3)
Chloride: 110 mmol/L (ref 98–111)
Creatinine, Ser: 0.72 mg/dL (ref 0.61–1.24)
GFR calc Af Amer: >60 mL/min (ref >60)
GFR calc non Af Amer: >60 mL/min (ref >60)
Glucose, Bld: 135 mg/dL — ABNORMAL HIGH (ref 70–99)
Potassium: 4.2 mmol/L (ref 3.5–5.1)
Sodium: 139 mmol/L (ref 135–145)

## 2019-02-21 LAB — CBC
HCT: 36.4 % — ABNORMAL LOW (ref 39.0–52.0)
Hemoglobin: 12.3 g/dL — ABNORMAL LOW (ref 13.0–17.0)
MCH: 34.9 pg — ABNORMAL HIGH (ref 26.0–34.0)
MCHC: 33.8 g/dL (ref 30.0–36.0)
MCV: 103.4 fL — ABNORMAL HIGH (ref 80.0–100.0)
Platelets: 160 10*3/uL (ref 150–400)
RBC: 3.52 MIL/uL — ABNORMAL LOW (ref 4.22–5.81)
RDW: 12.6 % (ref 11.5–15.5)
WBC: 11.5 10*3/uL — ABNORMAL HIGH (ref 4.0–10.5)
nRBC: 0 % (ref 0.0–0.2)

## 2019-02-21 MED ORDER — GABAPENTIN 100 MG PO CAPS: 100.0000 mg | ORAL_CAPSULE | Freq: Three times a day (TID) | ORAL | 0 refills | Status: DC

## 2019-02-21 MED ORDER — TRAMADOL HCL 50 MG PO TABS: 50.0000 mg | ORAL_TABLET | Freq: Four times a day (QID) | ORAL | 0 refills | Status: DC | PRN

## 2019-02-21 MED ORDER — METHOCARBAMOL 500 MG PO TABS: 500.0000 mg | ORAL_TABLET | Freq: Four times a day (QID) | ORAL | 0 refills | Status: DC | PRN

## 2019-02-21 MED ORDER — ASPIRIN 325 MG PO TBEC: 325.0000 mg | DELAYED_RELEASE_TABLET | Freq: Two times a day (BID) | ORAL | 0 refills | Status: AC

## 2019-02-21 MED ORDER — OXYCODONE HCL 5 MG PO TABS: 5.0000 mg | ORAL_TABLET | Freq: Four times a day (QID) | ORAL | 0 refills | Status: DC | PRN

## 2019-02-21 NOTE — TOC Transition Note (Signed)
Transition of Care [facility]) - CM/SW Discharge Note   Patient Details  Name: [name] MRN: [id number] Date of Birth: 1940/08/13  [facility]) CM/SW Contact:  Lia Hopping, [place] Phone Number: 02/21/2019, 2:21 PM   Clinical Narrative:    Patient reports he does not have a RW at home.  RW ordered through [facility] and will be delivered to the patient room.     Final next level of care: OP Rehab Barriers to Discharge: Barriers Resolved   Patient Goals and CMS Choice     Choice offered to / list presented to : NA  Discharge Placement                       Discharge Plan and Services                DME Arranged: Walker rolling DME Agency: [employer] Date DME Agency Contacted: 02/21/19 Time DME Agency Contacted: [phone] Representative spoke with at DME Agency: [place]: NA [place] Agency: NA        Social Determinants of Health ([place]) Interventions     Readmission Risk Interventions No flowsheet data found.

## 2019-02-21 NOTE — Progress Notes (Signed)
   Subjective: 1 Day Post-Op Procedure(s) (LRB): TOTAL KNEE ARTHROPLASTY (Left) Patient reports pain as mild.   Patient seen in rounds by Dr. Wynelle Link. Patient is well, and has had no acute complaints or problems other than pain in the left knee. No issues overnight. Denies chest pain, SOB, or calf pain. Foley catheter to be removed this AM.  We will continue therapy today.   Objective: Vital signs in last 24 hours: Temp:  [97.3 F (36.3 C)-98.2 F (36.8 C)] 97.8 F (36.6 C) (11/24 0509) Pulse Rate:  [57-72] 60 (11/24 0509) Resp:  [10-20] 18 (11/24 0509) BP: (110-147)/(64-79) 117/65 (11/24 0509) SpO2:  [93 %-100 %] 100 % (11/24 0509) Weight:  [86.3 kg] 86.3 kg (11/23 1255)  Intake/Output from previous day:  Intake/Output Summary (Last 24 hours) at 02/21/2019 0705 Last data filed at 02/21/2019 0600 Gross per 24 hour  Intake 4554.58 ml  Output 4340 ml  Net 214.58 ml    Labs: Recent Labs    02/21/19 0258  HGB 12.3*   Recent Labs    02/21/19 0258  WBC 11.5*  RBC 3.52*  HCT 36.4*  PLT 160   Recent Labs    02/21/19 0258  NA 139  K 4.2  CL 110  CO2 22  BUN 14  CREATININE 0.72  GLUCOSE 135*  CALCIUM 8.3*   Exam: General - Patient is Alert and Oriented Extremity - Neurologically intact Neurovascular intact Sensation intact distally Dorsiflexion/Plantar flexion intact Dressing - dressing C/D/I Motor Function - intact, moving foot and toes well on exam.   Past Medical History:  Diagnosis Date  . Arthritis    lower back  . Benign prostatic hypertrophy   . Elevated PSA   . Hypercholesterolemia   . Hypertension   . Multiple pulmonary nodules     Assessment/Plan: 1 Day Post-Op Procedure(s) (LRB): TOTAL KNEE ARTHROPLASTY (Left) Principal Problem:   OA (osteoarthritis) of knee  Estimated body mass index is 26.54 kg/m as calculated from the following:   Height as of this encounter: 5\' 11"  (1.803 m).   Weight as of this encounter: 86.3 kg. Advance  diet Up with therapy D/C IV fluids  Anticipated LOS equal to or greater than 2 midnights due to - Age 77 and older with one or more of the following:  - Obesity  - Expected need for hospital services (PT, OT, Nursing) required for safe  discharge  - Anticipated need for postoperative skilled nursing care or inpatient rehab  - Active co-morbidities: None OR   - Unanticipated findings during/Post Surgery: None  - Patient is a high risk of re-admission due to: None    DVT Prophylaxis - Aspirin Weight bearing as tolerated. D/C O2 and pulse ox and try on room air. Hemovac pulled without difficulty, will continue therapy today.  Plan is to go Home after hospital stay. Plan for discharge later today if progresses with therapy and meeting his goals. Scheduled for outpatient PT at [facility] in [place]. Follow-up in the office in 2 weeks.   Theresa Duty, PA-C Orthopedic Surgery 02/21/2019, 7:05 AM

## 2019-02-21 NOTE — Progress Notes (Signed)
Physical Therapy Treatment Patient Details Name: [name] MRN: [id number] DOB: 1940-11-04 Today's Date: 02/21/2019    History of Present Illness Patient is 78 y.o. male s/p Lt TKA on 02/20/19 with PMH significan for HTN, OA, and lumbar laminectomy L4-S1.    PT Comments    Progressing with mobility. Will plan to have a 2nd session prior to d/c.    Follow Up Recommendations  Follow surgeon's recommendation for DC plan and follow-up therapies     Equipment Recommendations  (wife bringing walker to hospital to see if it will be okay for pt to use (per eval note))    Recommendations for Other Services       Precautions / Restrictions Precautions Precautions: Fall Restrictions Weight Bearing Restrictions: No Other Position/Activity Restrictions: WBAT    Mobility  Bed Mobility Overal bed mobility: Needs Assistance Bed Mobility: Supine to Sit     Supine to sit: Supervision     General bed mobility comments: for safety  Transfers Overall transfer level: Needs assistance Equipment used: Rolling walker (2 wheeled) Transfers: Sit to/from Stand Sit to Stand: Min guard         General transfer comment: Close guard for safety. VCs safety, hand placement  Ambulation/Gait Ambulation/Gait assistance: Min guard Gait Distance (Feet): 100 Feet Assistive device: Rolling walker (2 wheeled) Gait Pattern/deviations: Step-through pattern;Decreased stride length     General Gait Details: for safety. VCs for safety, technique, flat foot instead of on toes.   Stairs Stairs: Yes Stairs assistance: Min guard Stair Management: Step to pattern;Forwards;With walker Number of Stairs: 1 General stair comments: VCs safety, technique, sequence. Close guard for safety.   Wheelchair Mobility    Modified Rankin (Stroke Patients Only)       Balance Overall balance assessment: Needs assistance         Standing balance support: Bilateral upper extremity  supported Standing balance-Leahy Scale: Fair                              Cognition Arousal/Alertness: Awake/alert Behavior During Therapy: WFL for tasks assessed/performed Overall Cognitive Status: Within Functional Limits for tasks assessed                                        Exercises Total Joint Exercises Ankle Circles/Pumps: AROM;Both;10 reps;Supine Quad Sets: AROM;Both;10 reps;Supine Heel Slides: AAROM;Left;10 reps;Supine Hip ABduction/ADduction: AAROM;Left;10 reps;Supine Straight Leg Raises: AROM;Left;10 reps;Supine Goniometric ROM: ~10-70 degrees    General Comments        Pertinent Vitals/Pain Pain Assessment: Faces Faces Pain Scale: Hurts even more Pain Location: L knee Pain Descriptors / Indicators: Discomfort;Sore Pain Intervention(s): Monitored during session;Ice applied;Repositioned    Home Living                      Prior Function            PT Goals (current goals can now be found in the care plan section) Progress towards PT goals: Progressing toward goals    Frequency    7X/week      PT Plan Current plan remains appropriate    Co-evaluation              AM-PAC PT "6 Clicks" Mobility   Outcome Measure  Help needed turning from your back to your side while in a flat bed without  using bedrails?: A Little Help needed moving from lying on your back to sitting on the side of a flat bed without using bedrails?: A Little Help needed moving to and from a bed to a chair (including a wheelchair)?: A Little Help needed standing up from a chair using your arms (e.g., wheelchair or bedside chair)?: A Little Help needed to walk in hospital room?: A Little Help needed climbing 3-5 steps with a railing? : A Little 6 Click Score: 18    End of Session Equipment Utilized During Treatment: Gait belt Activity Tolerance: Patient tolerated treatment well Patient left: in chair;with call bell/phone within  reach   PT Visit Diagnosis: Muscle weakness (generalized) (M62.81);Difficulty in walking, not elsewhere classified (R26.2)     Time: [id number] PT Time Calculation (min) (ACUTE ONLY): 18 min  Charges:  $Gait Training: 8-22 mins                       Weston Anna, PT Acute Rehabilitation Services Pager: [phone] Office: [phone]

## 2019-02-21 NOTE — Progress Notes (Signed)
Physical Therapy Treatment Patient Details Name: [name] MRN: [id number] DOB: 1940/08/28 Today's Date: 02/21/2019    History of Present Illness Patient is 78 y.o. male s/p Lt TKA on 02/20/19 with PMH significan for HTN, OA, and lumbar laminectomy L4-S1.    PT Comments    Progressing well with mobility. All PT education completed. Instructed pt to perform TKA exercises 2x/day until he is able to start OP PT. Wife present during 2nd session. She brought walker in from home. It is a standard RW without wheels. Recommend RW for ambulation. Called RN but she did not answer. Left note on board at [facility] that pt needs a RW ordered. Okay to d/c from PT standpoint.     Follow Up Recommendations  Follow surgeon's recommendation for DC plan and follow-up therapies     Equipment Recommendations  Rolling walker with 5" wheels    Recommendations for Other Services       Precautions / Restrictions Precautions Precautions: Fall Restrictions Weight Bearing Restrictions: No Other Position/Activity Restrictions: WBAT    Mobility  Bed Mobility Overal bed mobility: Needs Assistance Bed Mobility: Supine to Sit     Supine to sit: Supervision     General bed mobility comments: oob in recliner  Transfers Overall transfer level: Needs assistance Equipment used: Rolling walker (2 wheeled) Transfers: Sit to/from Stand Sit to Stand: Min guard         General transfer comment: Close guard for safety. VCs safety, hand placement  Ambulation/Gait Ambulation/Gait assistance: Min guard Gait Distance (Feet): 115 Feet Assistive device: Rolling walker (2 wheeled) Gait Pattern/deviations: Step-through pattern;Decreased stride length     General Gait Details: for safety. VCs for safety, technique, flat foot instead of on toes.   Stairs Stairs: Yes Stairs assistance: Min guard Stair Management: Step to pattern;Forwards;With walker Number of Stairs: 1 General  stair comments: VCs safety, technique, sequence. Close guard for safety.   Wheelchair Mobility    Modified Rankin (Stroke Patients Only)       Balance Overall balance assessment: Needs assistance         Standing balance support: Bilateral upper extremity supported Standing balance-Leahy Scale: Fair                              Cognition Arousal/Alertness: Awake/alert Behavior During Therapy: WFL for tasks assessed/performed Overall Cognitive Status: Within Functional Limits for tasks assessed                                        Exercises Total Joint Exercises Ankle Circles/Pumps: AROM;Both;10 reps;Supine Quad Sets: AROM;Both;10 reps;Supine Heel Slides: AAROM;Left;10 reps;Supine Hip ABduction/ADduction: AAROM;Left;10 reps;Supine Straight Leg Raises: AROM;Left;10 reps;Supine Goniometric ROM: ~10-70 degrees    General Comments        Pertinent Vitals/Pain Pain Assessment: Faces Faces Pain Scale: Hurts even more Pain Location: L knee Pain Descriptors / Indicators: Discomfort;Sore Pain Intervention(s): Monitored during session;Repositioned    Home Living                      Prior Function            PT Goals (current goals can now be found in the care plan section) Progress towards PT goals: Progressing toward goals    Frequency    7X/week      PT  Plan Current plan remains appropriate    Co-evaluation              AM-PAC PT "6 Clicks" Mobility   Outcome Measure  Help needed turning from your back to your side while in a flat bed without using bedrails?: A Little Help needed moving from lying on your back to sitting on the side of a flat bed without using bedrails?: A Little Help needed moving to and from a bed to a chair (including a wheelchair)?: A Little Help needed standing up from a chair using your arms (e.g., wheelchair or bedside chair)?: A Little Help needed to walk in hospital room?: A  Little Help needed climbing 3-5 steps with a railing? : A Little 6 Click Score: 18    End of Session Equipment Utilized During Treatment: Gait belt Activity Tolerance: Patient tolerated treatment well Patient left: in bed;with call bell/phone within reach;with family/visitor present(sitting EOB)   PT Visit Diagnosis: Muscle weakness (generalized) (M62.81);Difficulty in walking, not elsewhere classified (R26.2)     Time: [id number] PT Time Calculation (min) (ACUTE ONLY): 10 min  Charges:  $Gait Training: 8-22 mins                       Weston Anna, PT Acute Rehabilitation Services Pager: [phone] Office: [phone]

## 2019-02-21 NOTE — Plan of Care (Signed)

## 2019-02-24 DIAGNOSIS — Z471 Aftercare following joint replacement surgery: Secondary | ICD-10-CM | POA: Diagnosis not present

## 2019-02-24 DIAGNOSIS — M25562 Pain in left knee: Secondary | ICD-10-CM | POA: Diagnosis not present

## 2019-02-24 DIAGNOSIS — Z96652 Presence of left artificial knee joint: Secondary | ICD-10-CM | POA: Diagnosis not present

## 2019-02-24 DIAGNOSIS — R2689 Other abnormalities of gait and mobility: Secondary | ICD-10-CM | POA: Diagnosis not present

## 2019-02-24 DIAGNOSIS — M6281 Muscle weakness (generalized): Secondary | ICD-10-CM | POA: Diagnosis not present

## 2019-02-27 DIAGNOSIS — Z471 Aftercare following joint replacement surgery: Secondary | ICD-10-CM | POA: Diagnosis not present

## 2019-02-27 DIAGNOSIS — R2689 Other abnormalities of gait and mobility: Secondary | ICD-10-CM | POA: Diagnosis not present

## 2019-02-27 DIAGNOSIS — Z96652 Presence of left artificial knee joint: Secondary | ICD-10-CM | POA: Diagnosis not present

## 2019-02-27 DIAGNOSIS — M25562 Pain in left knee: Secondary | ICD-10-CM | POA: Diagnosis not present

## 2019-02-27 DIAGNOSIS — M6281 Muscle weakness (generalized): Secondary | ICD-10-CM | POA: Diagnosis not present

## 2019-03-01 DIAGNOSIS — Z96652 Presence of left artificial knee joint: Secondary | ICD-10-CM | POA: Diagnosis not present

## 2019-03-01 DIAGNOSIS — M6281 Muscle weakness (generalized): Secondary | ICD-10-CM | POA: Diagnosis not present

## 2019-03-01 DIAGNOSIS — Z471 Aftercare following joint replacement surgery: Secondary | ICD-10-CM | POA: Diagnosis not present

## 2019-03-01 DIAGNOSIS — M25562 Pain in left knee: Secondary | ICD-10-CM | POA: Diagnosis not present

## 2019-03-01 DIAGNOSIS — R2689 Other abnormalities of gait and mobility: Secondary | ICD-10-CM | POA: Diagnosis not present

## 2019-03-03 DIAGNOSIS — M6281 Muscle weakness (generalized): Secondary | ICD-10-CM | POA: Diagnosis not present

## 2019-03-03 DIAGNOSIS — R2689 Other abnormalities of gait and mobility: Secondary | ICD-10-CM | POA: Diagnosis not present

## 2019-03-03 DIAGNOSIS — Z96652 Presence of left artificial knee joint: Secondary | ICD-10-CM | POA: Diagnosis not present

## 2019-03-03 DIAGNOSIS — M25562 Pain in left knee: Secondary | ICD-10-CM | POA: Diagnosis not present

## 2019-03-03 DIAGNOSIS — Z471 Aftercare following joint replacement surgery: Secondary | ICD-10-CM | POA: Diagnosis not present

## 2019-03-06 DIAGNOSIS — R2689 Other abnormalities of gait and mobility: Secondary | ICD-10-CM | POA: Diagnosis not present

## 2019-03-06 DIAGNOSIS — M6281 Muscle weakness (generalized): Secondary | ICD-10-CM | POA: Diagnosis not present

## 2019-03-06 DIAGNOSIS — Z96652 Presence of left artificial knee joint: Secondary | ICD-10-CM | POA: Diagnosis not present

## 2019-03-06 DIAGNOSIS — Z471 Aftercare following joint replacement surgery: Secondary | ICD-10-CM | POA: Diagnosis not present

## 2019-03-06 DIAGNOSIS — M25562 Pain in left knee: Secondary | ICD-10-CM | POA: Diagnosis not present

## 2019-03-08 DIAGNOSIS — M25562 Pain in left knee: Secondary | ICD-10-CM | POA: Diagnosis not present

## 2019-03-08 DIAGNOSIS — R2689 Other abnormalities of gait and mobility: Secondary | ICD-10-CM | POA: Diagnosis not present

## 2019-03-08 DIAGNOSIS — M6281 Muscle weakness (generalized): Secondary | ICD-10-CM | POA: Diagnosis not present

## 2019-03-08 DIAGNOSIS — Z96652 Presence of left artificial knee joint: Secondary | ICD-10-CM | POA: Diagnosis not present

## 2019-03-08 DIAGNOSIS — Z471 Aftercare following joint replacement surgery: Secondary | ICD-10-CM | POA: Diagnosis not present

## 2019-03-09 ENCOUNTER — Other Ambulatory Visit: Payer: Self-pay

## 2019-03-09 NOTE — Patient Outreach (Signed)
[facility]) Care Management  03/09/2019  [name] [birth year] [id number]   Medication Adherence call to [name] Hipp[facility] Identifiers Verify spoke with patient he is past due on Atorvastatin 20 mg,patient explain he takes 1 tablet daily,he ask to call [facility] an order this medication,pharmacy will have it ready for pt to pick up,patient said he just a knee surgery reason why he had not order it. [name] is showing past due under [facility].   [facility] Management Direct Dial [phone]  Fax [phone] [name]

## 2019-03-10 DIAGNOSIS — Z96652 Presence of left artificial knee joint: Secondary | ICD-10-CM | POA: Diagnosis not present

## 2019-03-10 DIAGNOSIS — Z471 Aftercare following joint replacement surgery: Secondary | ICD-10-CM | POA: Diagnosis not present

## 2019-03-10 DIAGNOSIS — R2689 Other abnormalities of gait and mobility: Secondary | ICD-10-CM | POA: Diagnosis not present

## 2019-03-10 DIAGNOSIS — M6281 Muscle weakness (generalized): Secondary | ICD-10-CM | POA: Diagnosis not present

## 2019-03-10 DIAGNOSIS — M25562 Pain in left knee: Secondary | ICD-10-CM | POA: Diagnosis not present

## 2019-03-13 DIAGNOSIS — Z471 Aftercare following joint replacement surgery: Secondary | ICD-10-CM | POA: Diagnosis not present

## 2019-03-13 DIAGNOSIS — Z96652 Presence of left artificial knee joint: Secondary | ICD-10-CM | POA: Diagnosis not present

## 2019-03-13 DIAGNOSIS — M25562 Pain in left knee: Secondary | ICD-10-CM | POA: Diagnosis not present

## 2019-03-13 DIAGNOSIS — M6281 Muscle weakness (generalized): Secondary | ICD-10-CM | POA: Diagnosis not present

## 2019-03-13 DIAGNOSIS — R2689 Other abnormalities of gait and mobility: Secondary | ICD-10-CM | POA: Diagnosis not present

## 2019-03-15 DIAGNOSIS — R2689 Other abnormalities of gait and mobility: Secondary | ICD-10-CM | POA: Diagnosis not present

## 2019-03-15 DIAGNOSIS — Z96652 Presence of left artificial knee joint: Secondary | ICD-10-CM | POA: Diagnosis not present

## 2019-03-15 DIAGNOSIS — Z471 Aftercare following joint replacement surgery: Secondary | ICD-10-CM | POA: Diagnosis not present

## 2019-03-15 DIAGNOSIS — M6281 Muscle weakness (generalized): Secondary | ICD-10-CM | POA: Diagnosis not present

## 2019-03-15 DIAGNOSIS — M25562 Pain in left knee: Secondary | ICD-10-CM | POA: Diagnosis not present

## 2019-03-16 DIAGNOSIS — I10 Essential (primary) hypertension: Secondary | ICD-10-CM | POA: Diagnosis not present

## 2019-03-16 DIAGNOSIS — T85398A Other mechanical complication of other ocular prosthetic devices, implants and grafts, initial encounter: Secondary | ICD-10-CM | POA: Diagnosis not present

## 2019-03-16 DIAGNOSIS — E7849 Other hyperlipidemia: Secondary | ICD-10-CM | POA: Diagnosis not present

## 2019-03-17 DIAGNOSIS — M6281 Muscle weakness (generalized): Secondary | ICD-10-CM | POA: Diagnosis not present

## 2019-03-17 DIAGNOSIS — Z96652 Presence of left artificial knee joint: Secondary | ICD-10-CM | POA: Diagnosis not present

## 2019-03-17 DIAGNOSIS — R2689 Other abnormalities of gait and mobility: Secondary | ICD-10-CM | POA: Diagnosis not present

## 2019-03-17 DIAGNOSIS — M25562 Pain in left knee: Secondary | ICD-10-CM | POA: Diagnosis not present

## 2019-03-17 DIAGNOSIS — Z471 Aftercare following joint replacement surgery: Secondary | ICD-10-CM | POA: Diagnosis not present

## 2019-03-20 DIAGNOSIS — Z471 Aftercare following joint replacement surgery: Secondary | ICD-10-CM | POA: Diagnosis not present

## 2019-03-20 DIAGNOSIS — M25562 Pain in left knee: Secondary | ICD-10-CM | POA: Diagnosis not present

## 2019-03-20 DIAGNOSIS — R2689 Other abnormalities of gait and mobility: Secondary | ICD-10-CM | POA: Diagnosis not present

## 2019-03-20 DIAGNOSIS — M6281 Muscle weakness (generalized): Secondary | ICD-10-CM | POA: Diagnosis not present

## 2019-03-20 DIAGNOSIS — Z96652 Presence of left artificial knee joint: Secondary | ICD-10-CM | POA: Diagnosis not present

## 2019-03-22 DIAGNOSIS — Z96652 Presence of left artificial knee joint: Secondary | ICD-10-CM | POA: Diagnosis not present

## 2019-03-22 DIAGNOSIS — M25562 Pain in left knee: Secondary | ICD-10-CM | POA: Diagnosis not present

## 2019-03-22 DIAGNOSIS — R2689 Other abnormalities of gait and mobility: Secondary | ICD-10-CM | POA: Diagnosis not present

## 2019-03-22 DIAGNOSIS — Z471 Aftercare following joint replacement surgery: Secondary | ICD-10-CM | POA: Diagnosis not present

## 2019-03-22 DIAGNOSIS — M6281 Muscle weakness (generalized): Secondary | ICD-10-CM | POA: Diagnosis not present

## 2019-03-23 DIAGNOSIS — Z471 Aftercare following joint replacement surgery: Secondary | ICD-10-CM | POA: Diagnosis not present

## 2019-03-23 DIAGNOSIS — R2689 Other abnormalities of gait and mobility: Secondary | ICD-10-CM | POA: Diagnosis not present

## 2019-03-23 DIAGNOSIS — M25562 Pain in left knee: Secondary | ICD-10-CM | POA: Diagnosis not present

## 2019-03-23 DIAGNOSIS — Z96652 Presence of left artificial knee joint: Secondary | ICD-10-CM | POA: Diagnosis not present

## 2019-03-23 DIAGNOSIS — M6281 Muscle weakness (generalized): Secondary | ICD-10-CM | POA: Diagnosis not present

## 2019-03-27 DIAGNOSIS — R2689 Other abnormalities of gait and mobility: Secondary | ICD-10-CM | POA: Diagnosis not present

## 2019-03-27 DIAGNOSIS — M6281 Muscle weakness (generalized): Secondary | ICD-10-CM | POA: Diagnosis not present

## 2019-03-27 DIAGNOSIS — M25562 Pain in left knee: Secondary | ICD-10-CM | POA: Diagnosis not present

## 2019-03-27 DIAGNOSIS — Z471 Aftercare following joint replacement surgery: Secondary | ICD-10-CM | POA: Diagnosis not present

## 2019-03-27 DIAGNOSIS — Z96652 Presence of left artificial knee joint: Secondary | ICD-10-CM | POA: Diagnosis not present

## 2019-03-28 DIAGNOSIS — M6281 Muscle weakness (generalized): Secondary | ICD-10-CM | POA: Diagnosis not present

## 2019-03-28 DIAGNOSIS — Z96652 Presence of left artificial knee joint: Secondary | ICD-10-CM | POA: Diagnosis not present

## 2019-03-28 DIAGNOSIS — R2689 Other abnormalities of gait and mobility: Secondary | ICD-10-CM | POA: Diagnosis not present

## 2019-03-28 DIAGNOSIS — M25562 Pain in left knee: Secondary | ICD-10-CM | POA: Diagnosis not present

## 2019-03-28 DIAGNOSIS — Z471 Aftercare following joint replacement surgery: Secondary | ICD-10-CM | POA: Diagnosis not present

## 2019-03-29 DIAGNOSIS — M25562 Pain in left knee: Secondary | ICD-10-CM | POA: Diagnosis not present

## 2019-03-29 DIAGNOSIS — Z96652 Presence of left artificial knee joint: Secondary | ICD-10-CM | POA: Diagnosis not present

## 2019-03-29 DIAGNOSIS — R2689 Other abnormalities of gait and mobility: Secondary | ICD-10-CM | POA: Diagnosis not present

## 2019-03-29 DIAGNOSIS — Z471 Aftercare following joint replacement surgery: Secondary | ICD-10-CM | POA: Diagnosis not present

## 2019-03-29 DIAGNOSIS — M6281 Muscle weakness (generalized): Secondary | ICD-10-CM | POA: Diagnosis not present

## 2019-03-30 DIAGNOSIS — Z96652 Presence of left artificial knee joint: Secondary | ICD-10-CM | POA: Diagnosis not present

## 2019-03-30 DIAGNOSIS — Z471 Aftercare following joint replacement surgery: Secondary | ICD-10-CM | POA: Diagnosis not present

## 2019-04-12 DIAGNOSIS — Z20822 Contact with and (suspected) exposure to covid-19: Secondary | ICD-10-CM | POA: Diagnosis not present

## 2019-04-24 DIAGNOSIS — I10 Essential (primary) hypertension: Secondary | ICD-10-CM | POA: Diagnosis not present

## 2019-04-24 DIAGNOSIS — E7849 Other hyperlipidemia: Secondary | ICD-10-CM | POA: Diagnosis not present

## 2019-05-10 DIAGNOSIS — I10 Essential (primary) hypertension: Secondary | ICD-10-CM | POA: Diagnosis not present

## 2019-05-10 DIAGNOSIS — J309 Allergic rhinitis, unspecified: Secondary | ICD-10-CM | POA: Diagnosis not present

## 2019-05-10 DIAGNOSIS — Z0000 Encounter for general adult medical examination without abnormal findings: Secondary | ICD-10-CM | POA: Diagnosis not present

## 2019-05-10 DIAGNOSIS — E7849 Other hyperlipidemia: Secondary | ICD-10-CM | POA: Diagnosis not present

## 2019-05-25 DIAGNOSIS — E7849 Other hyperlipidemia: Secondary | ICD-10-CM | POA: Diagnosis not present

## 2019-05-25 DIAGNOSIS — I10 Essential (primary) hypertension: Secondary | ICD-10-CM | POA: Diagnosis not present

## 2019-06-22 DIAGNOSIS — E7849 Other hyperlipidemia: Secondary | ICD-10-CM | POA: Diagnosis not present

## 2019-06-22 DIAGNOSIS — I10 Essential (primary) hypertension: Secondary | ICD-10-CM | POA: Diagnosis not present

## 2019-07-20 DIAGNOSIS — I10 Essential (primary) hypertension: Secondary | ICD-10-CM | POA: Diagnosis not present

## 2019-07-20 DIAGNOSIS — E7849 Other hyperlipidemia: Secondary | ICD-10-CM | POA: Diagnosis not present

## 2019-08-07 DIAGNOSIS — E7849 Other hyperlipidemia: Secondary | ICD-10-CM | POA: Diagnosis not present

## 2019-08-07 DIAGNOSIS — I10 Essential (primary) hypertension: Secondary | ICD-10-CM | POA: Diagnosis not present

## 2019-08-07 DIAGNOSIS — J309 Allergic rhinitis, unspecified: Secondary | ICD-10-CM | POA: Diagnosis not present

## 2019-08-07 DIAGNOSIS — Z0000 Encounter for general adult medical examination without abnormal findings: Secondary | ICD-10-CM | POA: Diagnosis not present

## 2019-08-11 DIAGNOSIS — E7849 Other hyperlipidemia: Secondary | ICD-10-CM | POA: Diagnosis not present

## 2019-08-11 DIAGNOSIS — J309 Allergic rhinitis, unspecified: Secondary | ICD-10-CM | POA: Diagnosis not present

## 2019-08-11 DIAGNOSIS — Z0000 Encounter for general adult medical examination without abnormal findings: Secondary | ICD-10-CM | POA: Diagnosis not present

## 2019-08-11 DIAGNOSIS — I10 Essential (primary) hypertension: Secondary | ICD-10-CM | POA: Diagnosis not present

## 2019-08-21 DIAGNOSIS — E7849 Other hyperlipidemia: Secondary | ICD-10-CM | POA: Diagnosis not present

## 2019-08-21 DIAGNOSIS — I10 Essential (primary) hypertension: Secondary | ICD-10-CM | POA: Diagnosis not present

## 2019-09-18 DIAGNOSIS — I10 Essential (primary) hypertension: Secondary | ICD-10-CM | POA: Diagnosis not present

## 2019-09-18 DIAGNOSIS — E7849 Other hyperlipidemia: Secondary | ICD-10-CM | POA: Diagnosis not present

## 2019-10-03 DIAGNOSIS — Z1211 Encounter for screening for malignant neoplasm of colon: Secondary | ICD-10-CM | POA: Diagnosis not present

## 2019-10-03 DIAGNOSIS — Z1212 Encounter for screening for malignant neoplasm of rectum: Secondary | ICD-10-CM | POA: Diagnosis not present

## 2019-10-18 DIAGNOSIS — Z01818 Encounter for other preprocedural examination: Secondary | ICD-10-CM | POA: Diagnosis not present

## 2019-10-20 DIAGNOSIS — K620 Anal polyp: Secondary | ICD-10-CM | POA: Diagnosis not present

## 2019-10-20 DIAGNOSIS — K635 Polyp of colon: Secondary | ICD-10-CM | POA: Diagnosis not present

## 2019-10-20 DIAGNOSIS — D126 Benign neoplasm of colon, unspecified: Secondary | ICD-10-CM | POA: Diagnosis not present

## 2019-10-20 DIAGNOSIS — Z1211 Encounter for screening for malignant neoplasm of colon: Secondary | ICD-10-CM | POA: Diagnosis not present

## 2019-10-20 DIAGNOSIS — E785 Hyperlipidemia, unspecified: Secondary | ICD-10-CM | POA: Diagnosis not present

## 2019-10-20 DIAGNOSIS — K5730 Diverticulosis of large intestine without perforation or abscess without bleeding: Secondary | ICD-10-CM | POA: Diagnosis not present

## 2019-10-25 DIAGNOSIS — E7849 Other hyperlipidemia: Secondary | ICD-10-CM | POA: Diagnosis not present

## 2019-10-25 DIAGNOSIS — I10 Essential (primary) hypertension: Secondary | ICD-10-CM | POA: Diagnosis not present

## 2019-11-06 DIAGNOSIS — H40033 Anatomical narrow angle, bilateral: Secondary | ICD-10-CM | POA: Diagnosis not present

## 2019-11-06 DIAGNOSIS — H10013 Acute follicular conjunctivitis, bilateral: Secondary | ICD-10-CM | POA: Diagnosis not present

## 2019-11-07 DIAGNOSIS — D124 Benign neoplasm of descending colon: Secondary | ICD-10-CM | POA: Diagnosis not present

## 2019-11-07 DIAGNOSIS — E7849 Other hyperlipidemia: Secondary | ICD-10-CM | POA: Diagnosis not present

## 2019-11-07 DIAGNOSIS — J309 Allergic rhinitis, unspecified: Secondary | ICD-10-CM | POA: Diagnosis not present

## 2019-11-07 DIAGNOSIS — I10 Essential (primary) hypertension: Secondary | ICD-10-CM | POA: Diagnosis not present

## 2019-11-09 DIAGNOSIS — E7849 Other hyperlipidemia: Secondary | ICD-10-CM | POA: Diagnosis not present

## 2019-11-09 DIAGNOSIS — I10 Essential (primary) hypertension: Secondary | ICD-10-CM | POA: Diagnosis not present

## 2020-01-01 DIAGNOSIS — I10 Essential (primary) hypertension: Secondary | ICD-10-CM | POA: Diagnosis not present

## 2020-01-01 DIAGNOSIS — E7849 Other hyperlipidemia: Secondary | ICD-10-CM | POA: Diagnosis not present

## 2020-02-02 DIAGNOSIS — I10 Essential (primary) hypertension: Secondary | ICD-10-CM | POA: Diagnosis not present

## 2020-02-02 DIAGNOSIS — E7849 Other hyperlipidemia: Secondary | ICD-10-CM | POA: Diagnosis not present

## 2020-02-26 DIAGNOSIS — I10 Essential (primary) hypertension: Secondary | ICD-10-CM | POA: Diagnosis not present

## 2020-02-26 DIAGNOSIS — E7849 Other hyperlipidemia: Secondary | ICD-10-CM | POA: Diagnosis not present

## 2020-02-26 DIAGNOSIS — J309 Allergic rhinitis, unspecified: Secondary | ICD-10-CM | POA: Diagnosis not present

## 2020-03-04 DIAGNOSIS — M9901 Segmental and somatic dysfunction of cervical region: Secondary | ICD-10-CM | POA: Diagnosis not present

## 2020-03-04 DIAGNOSIS — M47812 Spondylosis without myelopathy or radiculopathy, cervical region: Secondary | ICD-10-CM | POA: Diagnosis not present

## 2020-03-08 DIAGNOSIS — M47812 Spondylosis without myelopathy or radiculopathy, cervical region: Secondary | ICD-10-CM | POA: Diagnosis not present

## 2020-03-08 DIAGNOSIS — M9901 Segmental and somatic dysfunction of cervical region: Secondary | ICD-10-CM | POA: Diagnosis not present

## 2020-03-12 DIAGNOSIS — M47812 Spondylosis without myelopathy or radiculopathy, cervical region: Secondary | ICD-10-CM | POA: Diagnosis not present

## 2020-03-12 DIAGNOSIS — M9901 Segmental and somatic dysfunction of cervical region: Secondary | ICD-10-CM | POA: Diagnosis not present

## 2020-03-13 DIAGNOSIS — E7849 Other hyperlipidemia: Secondary | ICD-10-CM | POA: Diagnosis not present

## 2020-03-13 DIAGNOSIS — I10 Essential (primary) hypertension: Secondary | ICD-10-CM | POA: Diagnosis not present

## 2020-03-18 DIAGNOSIS — M47812 Spondylosis without myelopathy or radiculopathy, cervical region: Secondary | ICD-10-CM | POA: Diagnosis not present

## 2020-03-18 DIAGNOSIS — M9901 Segmental and somatic dysfunction of cervical region: Secondary | ICD-10-CM | POA: Diagnosis not present

## 2020-03-20 DIAGNOSIS — M47812 Spondylosis without myelopathy or radiculopathy, cervical region: Secondary | ICD-10-CM | POA: Diagnosis not present

## 2020-03-20 DIAGNOSIS — M9901 Segmental and somatic dysfunction of cervical region: Secondary | ICD-10-CM | POA: Diagnosis not present

## 2020-03-27 DIAGNOSIS — M9901 Segmental and somatic dysfunction of cervical region: Secondary | ICD-10-CM | POA: Diagnosis not present

## 2020-03-27 DIAGNOSIS — M47812 Spondylosis without myelopathy or radiculopathy, cervical region: Secondary | ICD-10-CM | POA: Diagnosis not present

## 2020-04-10 DIAGNOSIS — M9901 Segmental and somatic dysfunction of cervical region: Secondary | ICD-10-CM | POA: Diagnosis not present

## 2020-04-10 DIAGNOSIS — M47812 Spondylosis without myelopathy or radiculopathy, cervical region: Secondary | ICD-10-CM | POA: Diagnosis not present

## 2020-05-28 DIAGNOSIS — I10 Essential (primary) hypertension: Secondary | ICD-10-CM | POA: Diagnosis not present

## 2020-05-28 DIAGNOSIS — Z131 Encounter for screening for diabetes mellitus: Secondary | ICD-10-CM | POA: Diagnosis not present

## 2020-05-28 DIAGNOSIS — Z0000 Encounter for general adult medical examination without abnormal findings: Secondary | ICD-10-CM | POA: Diagnosis not present

## 2020-05-28 DIAGNOSIS — E7849 Other hyperlipidemia: Secondary | ICD-10-CM | POA: Diagnosis not present

## 2020-05-28 DIAGNOSIS — J309 Allergic rhinitis, unspecified: Secondary | ICD-10-CM | POA: Diagnosis not present

## 2020-07-03 DIAGNOSIS — M9901 Segmental and somatic dysfunction of cervical region: Secondary | ICD-10-CM | POA: Diagnosis not present

## 2020-07-03 DIAGNOSIS — M47812 Spondylosis without myelopathy or radiculopathy, cervical region: Secondary | ICD-10-CM | POA: Diagnosis not present

## 2020-07-15 DIAGNOSIS — E7849 Other hyperlipidemia: Secondary | ICD-10-CM | POA: Diagnosis not present

## 2020-07-15 DIAGNOSIS — I10 Essential (primary) hypertension: Secondary | ICD-10-CM | POA: Diagnosis not present

## 2020-07-15 DIAGNOSIS — J309 Allergic rhinitis, unspecified: Secondary | ICD-10-CM | POA: Diagnosis not present

## 2020-09-17 DIAGNOSIS — J309 Allergic rhinitis, unspecified: Secondary | ICD-10-CM | POA: Diagnosis not present

## 2020-09-17 DIAGNOSIS — E7849 Other hyperlipidemia: Secondary | ICD-10-CM | POA: Diagnosis not present

## 2020-09-17 DIAGNOSIS — I10 Essential (primary) hypertension: Secondary | ICD-10-CM | POA: Diagnosis not present

## 2020-09-17 DIAGNOSIS — Z0000 Encounter for general adult medical examination without abnormal findings: Secondary | ICD-10-CM | POA: Diagnosis not present

## 2020-09-25 DIAGNOSIS — N320 Bladder-neck obstruction: Secondary | ICD-10-CM | POA: Diagnosis not present

## 2020-09-25 DIAGNOSIS — K4090 Unilateral inguinal hernia, without obstruction or gangrene, not specified as recurrent: Secondary | ICD-10-CM | POA: Diagnosis not present

## 2020-12-04 DIAGNOSIS — H40033 Anatomical narrow angle, bilateral: Secondary | ICD-10-CM | POA: Diagnosis not present

## 2020-12-04 DIAGNOSIS — H2513 Age-related nuclear cataract, bilateral: Secondary | ICD-10-CM | POA: Diagnosis not present

## 2020-12-23 DIAGNOSIS — M810 Age-related osteoporosis without current pathological fracture: Secondary | ICD-10-CM | POA: Diagnosis not present

## 2020-12-23 DIAGNOSIS — M8588 Other specified disorders of bone density and structure, other site: Secondary | ICD-10-CM | POA: Diagnosis not present

## 2021-01-14 DIAGNOSIS — E7849 Other hyperlipidemia: Secondary | ICD-10-CM | POA: Diagnosis not present

## 2021-01-14 DIAGNOSIS — I10 Essential (primary) hypertension: Secondary | ICD-10-CM | POA: Diagnosis not present

## 2021-04-14 DIAGNOSIS — I10 Essential (primary) hypertension: Secondary | ICD-10-CM | POA: Diagnosis not present

## 2021-04-14 DIAGNOSIS — J40 Bronchitis, not specified as acute or chronic: Secondary | ICD-10-CM | POA: Diagnosis not present

## 2021-04-14 DIAGNOSIS — E7849 Other hyperlipidemia: Secondary | ICD-10-CM | POA: Diagnosis not present

## 2021-07-16 DIAGNOSIS — Z0000 Encounter for general adult medical examination without abnormal findings: Secondary | ICD-10-CM | POA: Diagnosis not present

## 2021-07-16 DIAGNOSIS — I10 Essential (primary) hypertension: Secondary | ICD-10-CM | POA: Diagnosis not present

## 2021-07-16 DIAGNOSIS — E7849 Other hyperlipidemia: Secondary | ICD-10-CM | POA: Diagnosis not present

## 2021-07-18 DIAGNOSIS — E7849 Other hyperlipidemia: Secondary | ICD-10-CM | POA: Diagnosis not present

## 2021-07-18 DIAGNOSIS — I10 Essential (primary) hypertension: Secondary | ICD-10-CM | POA: Diagnosis not present

## 2021-07-18 DIAGNOSIS — Z0000 Encounter for general adult medical examination without abnormal findings: Secondary | ICD-10-CM | POA: Diagnosis not present

## 2021-10-15 DIAGNOSIS — Z0000 Encounter for general adult medical examination without abnormal findings: Secondary | ICD-10-CM | POA: Diagnosis not present

## 2021-10-15 DIAGNOSIS — I10 Essential (primary) hypertension: Secondary | ICD-10-CM | POA: Diagnosis not present

## 2021-10-15 DIAGNOSIS — E7849 Other hyperlipidemia: Secondary | ICD-10-CM | POA: Diagnosis not present

## 2021-12-29 DIAGNOSIS — E7849 Other hyperlipidemia: Secondary | ICD-10-CM | POA: Diagnosis not present

## 2021-12-29 DIAGNOSIS — M722 Plantar fascial fibromatosis: Secondary | ICD-10-CM | POA: Diagnosis not present

## 2021-12-29 DIAGNOSIS — I10 Essential (primary) hypertension: Secondary | ICD-10-CM | POA: Diagnosis not present

## 2022-02-27 DIAGNOSIS — J069 Acute upper respiratory infection, unspecified: Secondary | ICD-10-CM | POA: Diagnosis not present

## 2022-03-02 DIAGNOSIS — J208 Acute bronchitis due to other specified organisms: Secondary | ICD-10-CM | POA: Diagnosis not present

## 2022-03-09 DIAGNOSIS — J0121 Acute recurrent ethmoidal sinusitis: Secondary | ICD-10-CM | POA: Diagnosis not present

## 2022-04-02 DIAGNOSIS — M1712 Unilateral primary osteoarthritis, left knee: Secondary | ICD-10-CM | POA: Diagnosis not present

## 2022-04-02 DIAGNOSIS — I10 Essential (primary) hypertension: Secondary | ICD-10-CM | POA: Diagnosis not present

## 2022-04-02 DIAGNOSIS — J309 Allergic rhinitis, unspecified: Secondary | ICD-10-CM | POA: Diagnosis not present

## 2022-04-02 DIAGNOSIS — E7849 Other hyperlipidemia: Secondary | ICD-10-CM | POA: Diagnosis not present

## 2022-04-02 DIAGNOSIS — Z0000 Encounter for general adult medical examination without abnormal findings: Secondary | ICD-10-CM | POA: Diagnosis not present

## 2022-04-03 DIAGNOSIS — J0121 Acute recurrent ethmoidal sinusitis: Secondary | ICD-10-CM | POA: Diagnosis not present

## 2022-04-03 DIAGNOSIS — M1712 Unilateral primary osteoarthritis, left knee: Secondary | ICD-10-CM | POA: Diagnosis not present

## 2022-04-03 DIAGNOSIS — Z0000 Encounter for general adult medical examination without abnormal findings: Secondary | ICD-10-CM | POA: Diagnosis not present

## 2022-04-03 DIAGNOSIS — E7849 Other hyperlipidemia: Secondary | ICD-10-CM | POA: Diagnosis not present

## 2022-04-03 DIAGNOSIS — J309 Allergic rhinitis, unspecified: Secondary | ICD-10-CM | POA: Diagnosis not present

## 2022-04-27 DIAGNOSIS — J45991 Cough variant asthma: Secondary | ICD-10-CM | POA: Diagnosis not present

## 2022-07-02 DIAGNOSIS — T1502XA Foreign body in cornea, left eye, initial encounter: Secondary | ICD-10-CM | POA: Diagnosis not present

## 2022-07-04 DIAGNOSIS — Z87891 Personal history of nicotine dependence: Secondary | ICD-10-CM | POA: Diagnosis not present

## 2022-07-04 DIAGNOSIS — I10 Essential (primary) hypertension: Secondary | ICD-10-CM | POA: Diagnosis not present

## 2022-07-04 DIAGNOSIS — M549 Dorsalgia, unspecified: Secondary | ICD-10-CM | POA: Diagnosis not present

## 2022-07-04 DIAGNOSIS — E860 Dehydration: Secondary | ICD-10-CM | POA: Diagnosis not present

## 2022-07-04 DIAGNOSIS — Z9842 Cataract extraction status, left eye: Secondary | ICD-10-CM | POA: Diagnosis not present

## 2022-07-04 DIAGNOSIS — I4891 Unspecified atrial fibrillation: Secondary | ICD-10-CM | POA: Diagnosis not present

## 2022-07-04 DIAGNOSIS — E785 Hyperlipidemia, unspecified: Secondary | ICD-10-CM | POA: Diagnosis not present

## 2022-07-04 DIAGNOSIS — Z79899 Other long term (current) drug therapy: Secondary | ICD-10-CM | POA: Diagnosis not present

## 2022-07-04 DIAGNOSIS — D72829 Elevated white blood cell count, unspecified: Secondary | ICD-10-CM | POA: Diagnosis not present

## 2022-07-04 DIAGNOSIS — Z743 Need for continuous supervision: Secondary | ICD-10-CM | POA: Diagnosis not present

## 2022-07-04 DIAGNOSIS — Z7982 Long term (current) use of aspirin: Secondary | ICD-10-CM | POA: Diagnosis not present

## 2022-07-04 DIAGNOSIS — I959 Hypotension, unspecified: Secondary | ICD-10-CM | POA: Diagnosis not present

## 2022-07-04 DIAGNOSIS — Z7901 Long term (current) use of anticoagulants: Secondary | ICD-10-CM | POA: Diagnosis not present

## 2022-07-04 DIAGNOSIS — N179 Acute kidney failure, unspecified: Secondary | ICD-10-CM | POA: Diagnosis not present

## 2022-07-04 DIAGNOSIS — R404 Transient alteration of awareness: Secondary | ICD-10-CM | POA: Diagnosis not present

## 2022-07-04 DIAGNOSIS — Z9049 Acquired absence of other specified parts of digestive tract: Secondary | ICD-10-CM | POA: Diagnosis not present

## 2022-07-04 DIAGNOSIS — Z96652 Presence of left artificial knee joint: Secondary | ICD-10-CM | POA: Diagnosis not present

## 2022-07-04 DIAGNOSIS — R0902 Hypoxemia: Secondary | ICD-10-CM | POA: Diagnosis not present

## 2022-07-04 DIAGNOSIS — R6889 Other general symptoms and signs: Secondary | ICD-10-CM | POA: Diagnosis not present

## 2022-07-04 DIAGNOSIS — R42 Dizziness and giddiness: Secondary | ICD-10-CM | POA: Diagnosis not present

## 2022-07-04 DIAGNOSIS — Z9841 Cataract extraction status, right eye: Secondary | ICD-10-CM | POA: Diagnosis not present

## 2022-07-04 DIAGNOSIS — R55 Syncope and collapse: Secondary | ICD-10-CM | POA: Diagnosis not present

## 2022-07-09 DIAGNOSIS — I480 Paroxysmal atrial fibrillation: Secondary | ICD-10-CM | POA: Diagnosis not present

## 2022-07-09 DIAGNOSIS — I10 Essential (primary) hypertension: Secondary | ICD-10-CM | POA: Diagnosis not present

## 2022-07-29 DIAGNOSIS — I482 Chronic atrial fibrillation, unspecified: Secondary | ICD-10-CM | POA: Diagnosis not present

## 2022-08-04 ENCOUNTER — Encounter: Payer: Self-pay | Admitting: *Deleted

## 2022-08-05 ENCOUNTER — Encounter: Payer: Self-pay | Admitting: Internal Medicine

## 2022-08-05 ENCOUNTER — Ambulatory Visit: Payer: Medicare Other | Attending: Internal Medicine | Admitting: Internal Medicine

## 2022-08-05 ENCOUNTER — Other Ambulatory Visit (HOSPITAL_COMMUNITY): Payer: Self-pay

## 2022-08-05 ENCOUNTER — Telehealth: Payer: Self-pay | Admitting: *Deleted

## 2022-08-05 VITALS — BP 118/60 | HR 61 | Ht 70.0 in | Wt 190.4 lb

## 2022-08-05 DIAGNOSIS — I10 Essential (primary) hypertension: Secondary | ICD-10-CM

## 2022-08-05 DIAGNOSIS — I4891 Unspecified atrial fibrillation: Secondary | ICD-10-CM

## 2022-08-05 DIAGNOSIS — I480 Paroxysmal atrial fibrillation: Secondary | ICD-10-CM | POA: Diagnosis not present

## 2022-08-05 MED ORDER — APIXABAN 5 MG PO TABS
5.0000 mg | ORAL_TABLET | Freq: Two times a day (BID) | ORAL | 2 refills | Status: DC
  Filled 2022-08-05: qty 180, 90d supply, fill #0
  Filled 2022-08-06: qty 60, 30d supply, fill #0
  Filled 2022-08-31: qty 60, 30d supply, fill #1
  Filled 2022-09-30: qty 60, 30d supply, fill #2
  Filled 2022-10-30: qty 60, 30d supply, fill #3
  Filled 2022-11-26: qty 60, 30d supply, fill #4
  Filled 2022-12-27: qty 60, 30d supply, fill #5

## 2022-08-05 MED ORDER — DILTIAZEM HCL ER COATED BEADS 120 MG PO CP24
120.0000 mg | ORAL_CAPSULE | Freq: Every day | ORAL | 2 refills | Status: DC
  Filled 2022-08-05 – 2022-09-17 (×3): qty 90, 90d supply, fill #0
  Filled 2022-12-12: qty 90, 90d supply, fill #1

## 2022-08-05 MED ORDER — APIXABAN 5 MG PO TABS: 5.0000 mg | ORAL_TABLET | Freq: Two times a day (BID) | ORAL | 0 refills | Status: DC

## 2022-08-05 NOTE — Telephone Encounter (Signed)
Please check precert 

## 2022-08-05 NOTE — Telephone Encounter (Signed)
READY-NO PA REQ

## 2022-08-05 NOTE — Patient Instructions (Addendum)
Medication Instructions:  Your physician recommends that you continue on your current medications as directed. Please refer to the Current Medication list given to you today.  Labwork: none  Testing/Procedures: Your physician has requested that you have an echocardiogram. Echocardiography is a painless test that uses sound waves to create images of your heart. It provides your doctor with information about the size and shape of your heart and how well your heart's chambers and valves are working. This procedure takes approximately one hour. There are no restrictions for this procedure. Please do NOT wear cologne, perfume, aftershave, or lotions (deodorant is allowed). Please arrive 15 minutes prior to your appointment time. WatchPAT?  Is a FDA cleared portable home sleep study test that uses a watch and 3 points of contact to monitor 7 different channels, including your heart rate, oxygen saturations, body position, snoring, and chest motion.  The study is easy to use from the comfort of your own home and accurately detect sleep apnea.  Before bed, you attach the chest sensor, attached the sleep apnea bracelet to your nondominant hand, and attach the finger probe.  After the study, the raw data is downloaded from the watch and scored for apnea events.   For more information: [email or web address]  Patient Testing Instructions:  Do not put battery into the device until bedtime when you are ready to begin the test. Please call the support number if you need assistance after following the instructions below: 24 hour support line- [phone] or [facility] support at [phone] (option 2)  Download the [employer] One" app through the google play store or App Store  Be sure to turn on or enable access to bluetooth in settlings on your smartphone/ device  Make sure no other bluetooth devices are on and within the vicinity of your smartphone/ device and WatchPAT watch during  testing.  Make sure to leave your smart phone/ device plugged in and charging all night.  When ready for bed:  Follow the instructions step by step in the WatchPAT One App to activate the testing device. For additional instructions, including video instruction, visit the WatchPAT One video on Youtube. You can search for [place] One within Youtube (video is 4 minutes and 18 seconds) or enter: https://youtube/watch?[email or web address]_vbiwxE Please note: You will be prompted to enter a Pin to connect via bluetooth when starting the test. The PIN will be assigned to you when you receive the test.  The device is disposable, but it recommended that you retain the device until you receive a call letting you know the study has been received and the results have been interpreted.  We will let you know if the study did not transmit to [place] properly after the test is completed. You do not need to call us to confirm the receipt of the test.  Please complete the test within 48 hours of receiving PIN.   Frequently Asked Questions:  What is Watch [place] one?  A single use fully disposable home sleep apnea testing device and will not need to be returned after completion.  What are the requirements to use WatchPAT one?  The be able to have a successful watchpat one sleep study, you should have your Watch pat one device, your smart phone, watch pat one app, your PIN number and Internet access What type of phone do I need?  You should have a smart phone that uses Android 5.1 and above or any Iphone with IOS 10 and above How can I download the WatchPAT one  app?  Based on your device type search for WatchPAT one app either in google play for android devices or APP store for Iphone's Where will I get my PIN for the study?  Your PIN will be provided by your physician's office. It is used for authentication and if you lose/forget your PIN, please reach out to your providers office.  I do not have Internet at home. Can I do WatchPAT one  study?  WatchPAT One needs Internet connection throughout the night to be able to transmit the sleep data. You can use your home/local internet or your cellular's data package. However, it is always recommended to use home/local Internet. It is estimated that between 20MB-30MB will be used with each study.However, the application will be looking for space in the phone to start the study.  What happens if I lose internet or bluetooth connection?  During the internet disconnection, your phone will not be able to transmit the sleep data. All the data, will be stored in your phone. As soon as the internet connection is back on, the phone will being sending the sleep data. During the bluetooth disconnection, WatchPAT one will not be able to to send the sleep data to your phone. Data will be kept in the [facility] one until two devices have bluetooth connection back on. As soon as the connection is back on, WatchPAT one will send the sleep data to the phone.  How long do I need to wear the WatchPAT one?  After you start the study, you should wear the device at least 6 hours.  How far should I keep my phone from the device?  During the night, your phone should be within 15 feet.  What happens if I leave the room for restroom or other reasons?  Leaving the room for any reason will not cause any problem. As soon as your get back to the room, both devices will reconnect and will continue to send the sleep data. Can I use my phone during the sleep study?  Yes, you can use your phone as usual during the study. But it is recommended to put your watchpat one on when you are ready to go to bed.  How will I get my study results?  A soon as you completed your study, your sleep data will be sent to the provider. They will then share the results with you when they are ready.    Follow-Up: Your physician recommends that you schedule a follow-up appointment in: 6 months  Any Other Special Instructions Will Be  Listed Below (If Applicable).  If you need a refill on your cardiac medications before your next appointment, please call your pharmacy.

## 2022-08-05 NOTE — Progress Notes (Signed)
Cardiology Office Note  Date: 08/05/2022   ID: [name], DOB [birth year], MRN [id number]  PCP:  Toma Deiters, MD  Cardiologist:  Marjo Bicker, MD Electrophysiologist:  None   Reason for Office Visit: Post ER/hospitalization follow-up for A-fib with RVR   History of Present Illness: [name] is a 82 y.o. male known to have HTN, HLD, new onset A-fib diagnosed in 06/2022 was referred to cardiology clinic for evaluation of A-fib.  Patient has syncopal episode in 06/2022 with a BP 60/40 mmHg and HR 48 bpm. After EMS arrived, his heart rate went up to 125 bpm according to the wife and it never came down. He had to be admitted to [facility]. HR stabilized eventually with diltiazem.  He was discharged on diltiazem 120 mg once daily and Eliquis 5 mg twice daily. Patient was told he was dehydrated and hence had a syncopal episode. He reported drinking at least 2L of p.o. fluids. Upon discharge from [facility], denies any symptoms of angina, DOE, dizziness, syncope, leg swelling and palpitations.  EKG performed today showed NSR.  He was also never tested for OSA but.  Wife reports symptoms of snoring and nighttime apnea.  He underwent event monitor upon discharge from the [facility] which showed NSVT and no evidence of atrial fibrillation.  He had a few episodes of SVT, brief atrial tachycardia possibly.  No evidence of AV block or pauses.  Less than 1% PAC/PVC burden.  Past Medical History:  Diagnosis Date   Arthritis    lower back   Benign prostatic hypertrophy    Elevated PSA    Hypercholesterolemia    Hypertension    Multiple pulmonary nodules     Past Surgical History:  Procedure Laterality Date   APPENDECTOMY     Appendectomy in 1972.     arthroscopic surgery to knee in past     CATARACT EXTRACTION  2015   cut top of right index finer requiring surgery 09/2014 and top of index finger now numb Right 10/01/2014   ETHMOIDECTOMY Right 11/22/2017   Procedure: RIGHT TOTAL  ETHMOIDECTOMY;  Surgeon: Newman Pies, MD;  Location: [facility];  Service: ENT;  Laterality: Right;   FRONTAL SINUS EXPLORATION Right 11/22/2017   Procedure: FRONTAL SINUS EXPLORATION;  Surgeon: Newman Pies, MD;  Location: [facility];  Service: ENT;  Laterality: Right;   LUMBAR LAMINECTOMY N/A 11/22/2013   Procedure: MICRODISCECTOMY LUMBAR LAMINECTOMY;  Surgeon: Eldred Manges, MD;  Location: [facility];  Service: Orthopedics;  Laterality: N/A;  L4-5, L5-S1 Decompression   MAXILLARY ANTROSTOMY Right 11/22/2017   Procedure: MAXILLARY ANTROSTOMY WITH TISSUE REMOVAL;  Surgeon: Newman Pies, MD;  Location: [facility];  Service: ENT;  Laterality: Right;   NASAL SEPTOPLASTY W/ TURBINOPLASTY Bilateral 11/22/2017   Procedure: NASAL SEPTOPLASTY WITH BILATERAL TURBINATE REDUCTION;  Surgeon: Newman Pies, MD;  Location: [facility];  Service: ENT;  Laterality: Bilateral;   Scrotal cyst removed in 2002.     SINUS ENDO WITH FUSION Bilateral 11/22/2017   Procedure: SINUS ENDOSCOPY WITH FUSION NAVIGATION;  Surgeon: Newman Pies, MD;  Location: [facility];  Service: ENT;  Laterality: Bilateral;   TONSILLECTOMY     TOTAL KNEE ARTHROPLASTY Left 02/20/2019   Procedure: TOTAL KNEE ARTHROPLASTY;  Surgeon: Ollen Gross, MD;  Location: [facility];  Service: Orthopedics;  Laterality: Left;    Vasectomy in 1977.      Current Outpatient Medications  Medication Sig  Dispense Refill   acetaminophen (TYLENOL) 500 MG tablet Take 500-1,000 mg by mouth See admin instructions. Take 2 tablets (1000 mg) by mouth in the morning (scheduled), may take an additional dose in the evening if needed for pain.     atorvastatin (LIPITOR) 20 MG tablet Take 20 mg by mouth every evening.      Multiple Vitamin (MULTIVITAMIN WITH MINERALS) TABS tablet Take 1 tablet by mouth daily.     olmesartan (BENICAR) 20 MG tablet Take 1 tablet by mouth daily.     Omega-3 Fatty Acids (FISH OIL) 1200 MG CAPS  Take 1,200 mg by mouth daily.     omeprazole (PRILOSEC) 20 MG capsule Take 20 mg by mouth daily.     Polyethyl Glycol-Propyl Glycol (LUBRICANT EYE DROPS) 0.4-0.3 % SOLN Place 1 drop into both eyes 3 (three) times daily as needed (dry/irritated eyes.).     tadalafil (CIALIS) 5 MG tablet Take 5 mg by mouth daily.     tamsulosin (FLOMAX) 0.4 MG CAPS capsule Take 1 capsule by mouth daily.     apixaban (ELIQUIS) 5 MG TABS tablet Take 1 tablet (5 mg total) by mouth 2 (two) times daily. 180 tablet 2   diltiazem (CARDIZEM CD) 120 MG 24 hr capsule Take 1 capsule (120 mg total) by mouth daily. 90 capsule 2   No current facility-administered medications for this visit.   Allergies:  Patient has no known allergies.   Social History: The patient  reports that he quit smoking about 40 years ago. His smoking use included cigarettes. He has never used smokeless tobacco. He reports that he does not drink alcohol and does not use drugs.   Family History: The patient's family history is not on file.   ROS:  Please see the history of present illness. Otherwise, complete review of systems is positive for none  All other systems are reviewed and negative.   Physical Exam: VS:  BP 118/60   Pulse 61   Ht 5\' 10"  (1.778 m)   Wt 190 lb 6.4 oz (86.4 kg)   SpO2 95%   BMI 27.32 kg/m , BMI Body mass index is 27.32 kg/m.  Wt Readings from Last 3 Encounters:  08/05/22 190 lb 6.4 oz (86.4 kg)  02/20/19 190 lb 4.1 oz (86.3 kg)  02/15/19 190 lb 4.8 oz (86.3 kg)    General: Patient appears comfortable at rest. HEENT: Conjunctiva and lids normal, oropharynx clear with moist mucosa. Neck: Supple, no elevated JVP or carotid bruits, no thyromegaly. Lungs: Clear to auscultation, nonlabored breathing at rest. Cardiac: Regular rate and rhythm, no S3 or significant systolic murmur, no pericardial rub. Abdomen: Soft, nontender, no hepatomegaly, bowel sounds present, no guarding or rebound. Extremities: No pitting edema,  distal pulses 2+. Skin: Warm and dry. Musculoskeletal: No kyphosis. Neuropsychiatric: Alert and oriented x3, affect grossly appropriate.  Recent Labwork: No results found for requested labs within last 365 days.  No results found for: "CHOL", "TRIG", "HDL", "CHOLHDL", "VLDL", "LDLCALC", "LDLDIRECT"  Other Studies Reviewed Today: I reviewed the [facility] healthcare documentation regarding A-fib with RVR  Assessment and Plan:  Patient is a 82 year old M known to have HTN, HLD, new onset A-fib in 06/2022 was referred to cardiology clinic for evaluation of A-fib.  # Paroxysmal A-fib -ER visit in 06/2022 at [facility] health care for syncope secondary to dehydration with incidental finding of A-fib with RVR. He was stabilized and discharged on p.o. diltiazem 120 mg once daily and Eliquis 5 mg daily. He underwent  event monitor upon discharge which showed NSVT, few episodes of SVT which could be possibly atrial tachycardia, no evidence of AV block/pauses and less than 1% PAC/PVC burden. Normal serum creatinine and more than 60 kg weight. EKG today showed NSR. -Will continue the same medications, diltiazem 120 mg once daily and Eliquis 5 mg twice daily. No risk of falls. -Obtain 2D echocardiogram -TSH was normal limits. Obtain home sleep study for OSA evaluation.  # HTN, controlled: Continue olmesartan 20 mg once daily, HTN per PCP # HLD: Continue atorvastatin 20 mg nightly, HLD per PCP.   I have spent a total of 45 minutes with patient reviewing chart, EKGs, labs and examining patient as well as establishing an assessment and plan that was discussed with the patient.  > 50% of time was spent in direct patient care.    Medication Adjustments/Labs and Tests Ordered: Current medicines are reviewed at length with the patient today.  Concerns regarding medicines are outlined above.   Tests Ordered: Orders Placed This Encounter  Procedures   EKG 12-Lead   ECHOCARDIOGRAM COMPLETE   [facility] Sleep Study     Medication Changes: Meds ordered this encounter  Medications   DISCONTD: apixaban (ELIQUIS) 5 MG TABS tablet    Sig: Take 1 tablet (5 mg total) by mouth 2 (two) times daily.    Dispense:  28 tablet    Refill:  0    Order Specific Question:   Lot Number?    Answer:   [id number]    Order Specific Question:   Expiration Date?    Answer:   12/28/2023   apixaban (ELIQUIS) 5 MG TABS tablet    Sig: Take 1 tablet (5 mg total) by mouth 2 (two) times daily.    Dispense:  180 tablet    Refill:  2    Order Specific Question:   Lot Number?    Answer:   [id number]    Order Specific Question:   Expiration Date?    Answer:   12/28/2023   diltiazem (CARDIZEM CD) 120 MG 24 hr capsule    Sig: Take 1 capsule (120 mg total) by mouth daily.    Dispense:  90 capsule    Refill:  2    Disposition:  Follow up  6 months  Signed Jarod Bozzo Verne Spurr, MD, 08/05/2022 5:19 PM    [facility] at [facility] [address] [place], [place], [place] [address]

## 2022-08-06 ENCOUNTER — Other Ambulatory Visit: Payer: Self-pay

## 2022-08-07 ENCOUNTER — Other Ambulatory Visit (HOSPITAL_COMMUNITY): Payer: Self-pay

## 2022-08-17 ENCOUNTER — Telehealth: Payer: Self-pay | Admitting: Internal Medicine

## 2022-08-17 ENCOUNTER — Other Ambulatory Visit (HOSPITAL_COMMUNITY): Payer: Self-pay

## 2022-08-17 NOTE — Telephone Encounter (Signed)
Calling in about patient sleep apnea, states that they havent heard anything in two weeks. Please advise

## 2022-08-17 NOTE — Telephone Encounter (Signed)
Me  to TOKUO ABAJIAN      08/06/22  8:00 AM You may proceed with your home sleep study and pin code is [id number]

## 2022-08-18 ENCOUNTER — Encounter (INDEPENDENT_AMBULATORY_CARE_PROVIDER_SITE_OTHER): Payer: Medicare Other | Admitting: Cardiology

## 2022-08-18 DIAGNOSIS — G4733 Obstructive sleep apnea (adult) (pediatric): Secondary | ICD-10-CM

## 2022-08-18 NOTE — Telephone Encounter (Signed)
Spoke to patient's spouse who verbalized understanding. Patient will complete study tonight. Pt's spouse had no questions or concerns at this time.

## 2022-08-21 ENCOUNTER — Other Ambulatory Visit (HOSPITAL_COMMUNITY): Payer: Self-pay

## 2022-08-31 ENCOUNTER — Other Ambulatory Visit (HOSPITAL_COMMUNITY): Payer: Self-pay

## 2022-08-31 ENCOUNTER — Other Ambulatory Visit: Payer: Self-pay

## 2022-08-31 NOTE — Procedures (Signed)
Patient Information Study Date: 08/18/2022 Patient Name: [name] Patient ID: [id number] Birth Date: 16-Nov-1940 Age: 82 Gender: Male BMI: 27.1 (W=189 lb, H=5' 10'') Referring Physician: Luane School, MD  TEST DESCRIPTION: Home sleep apnea testing was completed using the WatchPat, a Type 1 device, utilizing peripheral arterial tonometry (PAT), chest movement, actigraphy, pulse oximetry, pulse rate, body position and snore. AHI was calculated with apnea and hypopnea using valid sleep time as the denominator. RDI includes apneas, hypopneas, and RERAs. The data acquired and the scoring of sleep and all associated events were performed in accordance with the recommended standards and specifications as outlined in the AASM Manual for the Scoring of Sleep and Associated Events 2.2.0 (2015).   FINDINGS:   1. Mild Obstructive Sleep Apnea with AHI 10.1/hr overall but moderate during REM sleep with REM AHI 18/hr.  2. No Central Sleep Apnea with pAHIc 1.5/hr.   3. Oxygen desaturations as low as 87%.   4. Severe snoring was present. O2 sats were < 88% for 1.1 min.   5. Total sleep time was 7 hrs and 15 min.   6. 27% of total sleep time was spent in REM sleep.   7. Shortened sleep onset latency at 5 min.   8. Shortened REM sleep onset latency at 56 min.   9. Total awakenings were 11.  10. Arrhythmia detection:  None  DIAGNOSIS: Mild Obstructive Sleep Apnea (G47.33)  RECOMMENDATIONS:   1.  Clinical correlation of these findings is necessary.  The decision to treat obstructive sleep apnea (OSA) is usually based on the presence of apnea symptoms or the presence of associated medical conditions such as Hypertension, Congestive Heart Failure, Atrial Fibrillation or Obesity.  The most common symptoms of OSA are snoring, gasping for breath while sleeping, daytime sleepiness and fatigue.   2.  Initiating apnea therapy is recommended given the presence of symptoms and/or associated conditions.  Recommend proceeding with one of the following:     a.  Auto-CPAP therapy with a pressure range of 5-20cm H2O.     b.  An oral appliance (OA) that can be obtained from certain dentists with expertise in sleep medicine.  These are primarily of use in non-obese patients with mild and moderate disease.     c.  An ENT consultation which may be useful to look for specific causes of obstruction and possible treatment options.     d.  If patient is intolerant to PAP therapy, consider referral to ENT for evaluation for hypoglossal nerve stimulator.   3.  Close follow-up is necessary to ensure success with CPAP or oral appliance therapy for maximum benefit.  4.  A follow-up oximetry study on CPAP is recommended to assess the adequacy of therapy and determine the need for supplemental oxygen or the potential need for Bi-level therapy.  An arterial blood gas to determine the adequacy of baseline ventilation and oxygenation should also be considered.  5.  Healthy sleep recommendations include:  adequate nightly sleep (normal 7-9 hrs/night), avoidance of caffeine after noon and alcohol near bedtime, and maintaining a sleep environment that is cool, dark and quiet.  6.  Weight loss for overweight patients is recommended.  Even modest amounts of weight loss can significantly improve the severity of sleep apnea.  7.  Snoring recommendations include:  weight loss where appropriate, side sleeping, and avoidance of alcohol before bed.  8.  Operation of motor vehicle should be avoided when sleepy.  Signature: Armanda Magic, MD; [facility]; Diplomat, American Board  of Sleep Medicine Electronically Signed: 08/31/2022 11:50:24 AM

## 2022-09-04 ENCOUNTER — Ambulatory Visit: Payer: Medicare Other | Attending: Internal Medicine

## 2022-09-04 DIAGNOSIS — I4891 Unspecified atrial fibrillation: Secondary | ICD-10-CM

## 2022-09-10 ENCOUNTER — Telehealth: Payer: Self-pay | Admitting: *Deleted

## 2022-09-10 DIAGNOSIS — G4733 Obstructive sleep apnea (adult) (pediatric): Secondary | ICD-10-CM

## 2022-09-10 DIAGNOSIS — I10 Essential (primary) hypertension: Secondary | ICD-10-CM

## 2022-09-10 NOTE — Telephone Encounter (Signed)
-----   Message from Quintella Reichert, MD sent at 08/31/2022 12:00 PM EDT ----- Please let patient know that they have sleep apnea and recommend treating with CPAP.  Please order an auto CPAP from 4-15cm H2O with heated humidity and mask of choice.  Order overnight pulse ox on CPAP.  Followup with me in 6 weeks.

## 2022-09-10 NOTE — Telephone Encounter (Signed)
The patient has been notified of the result and verbalized understanding.  All questions (if any) were answered. Latrelle Dodrill, CMA 09/10/2022 5:59 PM    Patient [name] will talk with his referring provider before he decides.Marland Kitchen

## 2022-09-16 ENCOUNTER — Ambulatory Visit (HOSPITAL_COMMUNITY)
Admission: RE | Admit: 2022-09-16 | Discharge: 2022-09-16 | Disposition: A | Discharge status: Home / Self Care | Payer: Medicare Other | Source: Ambulatory Visit | Attending: Internal Medicine | Admitting: Internal Medicine

## 2022-09-16 DIAGNOSIS — I4891 Unspecified atrial fibrillation: Secondary | ICD-10-CM | POA: Insufficient documentation

## 2022-09-16 LAB — ECHOCARDIOGRAM COMPLETE
AR max vel: 1.63 cm2
AV Area VTI: 1.97 cm2
AV Area mean vel: 1.81 cm2
AV Mean grad: 5 mmHg
AV Peak grad: 11.4 mmHg
Ao pk vel: 1.69 m/s
Area-P 1/2: 2.65 cm2
MV VTI: 2.88 cm2
S' Lateral: 3.8 cm
Single Plane A4C EF: 60.6 %

## 2022-09-16 NOTE — Progress Notes (Signed)
*  PRELIMINARY RESULTS* Echocardiogram 2D Echocardiogram has been performed.  [name] 09/16/2022, 9:13 AM

## 2022-09-17 ENCOUNTER — Other Ambulatory Visit: Payer: Self-pay

## 2022-09-17 ENCOUNTER — Other Ambulatory Visit (HOSPITAL_COMMUNITY): Payer: Self-pay

## 2022-09-17 NOTE — Telephone Encounter (Signed)
Pt spouse called back she stated pt does want to go ahead and get set up for a CPAP. Please advise.

## 2022-09-25 NOTE — Telephone Encounter (Signed)
Upon patient request DME selection is [facility]. Patient understands he will be contacted by [facility] to set up his cpap. Patient understands to call if [facility] does not contact him with new setup in a timely manner. Patient understands they will be called once confirmation has been received from [facility]/ that they have received their new machine to schedule 10 week follow up appointment.   [facility] notified of new cpap order  Please add to airview Patient was grateful for the call and thanked me. 

## 2022-09-25 NOTE — Addendum Note (Signed)
Addended by: Reesa Chew on: 09/25/2022 12:46 PM   Modules accepted: Orders

## 2022-10-02 ENCOUNTER — Other Ambulatory Visit: Payer: Self-pay

## 2022-10-03 ENCOUNTER — Other Ambulatory Visit (HOSPITAL_COMMUNITY): Payer: Self-pay

## 2022-10-05 DIAGNOSIS — G4733 Obstructive sleep apnea (adult) (pediatric): Secondary | ICD-10-CM | POA: Diagnosis not present

## 2022-10-06 DIAGNOSIS — I10 Essential (primary) hypertension: Secondary | ICD-10-CM | POA: Diagnosis not present

## 2022-10-06 DIAGNOSIS — Z0000 Encounter for general adult medical examination without abnormal findings: Secondary | ICD-10-CM | POA: Diagnosis not present

## 2022-10-06 DIAGNOSIS — I480 Paroxysmal atrial fibrillation: Secondary | ICD-10-CM | POA: Diagnosis not present

## 2022-10-09 DIAGNOSIS — Z0000 Encounter for general adult medical examination without abnormal findings: Secondary | ICD-10-CM | POA: Diagnosis not present

## 2022-10-09 DIAGNOSIS — I480 Paroxysmal atrial fibrillation: Secondary | ICD-10-CM | POA: Diagnosis not present

## 2022-10-09 DIAGNOSIS — I10 Essential (primary) hypertension: Secondary | ICD-10-CM | POA: Diagnosis not present

## 2022-10-15 DIAGNOSIS — G4730 Sleep apnea, unspecified: Secondary | ICD-10-CM | POA: Diagnosis not present

## 2022-10-30 ENCOUNTER — Other Ambulatory Visit: Payer: Self-pay

## 2022-11-03 ENCOUNTER — Encounter: Payer: Self-pay | Admitting: Cardiology

## 2022-11-05 DIAGNOSIS — G4733 Obstructive sleep apnea (adult) (pediatric): Secondary | ICD-10-CM | POA: Diagnosis not present

## 2022-11-26 ENCOUNTER — Encounter: Payer: Self-pay | Admitting: Pharmacist

## 2022-11-26 ENCOUNTER — Other Ambulatory Visit: Payer: Self-pay

## 2022-12-01 ENCOUNTER — Other Ambulatory Visit (HOSPITAL_COMMUNITY): Payer: Self-pay

## 2022-12-03 ENCOUNTER — Encounter: Payer: Self-pay | Admitting: Cardiology

## 2022-12-03 ENCOUNTER — Ambulatory Visit: Payer: Medicare Other | Attending: Cardiology | Admitting: Cardiology

## 2022-12-03 VITALS — BP 120/80 | HR 62 | Ht 70.0 in | Wt 193.8 lb

## 2022-12-03 DIAGNOSIS — I10 Essential (primary) hypertension: Secondary | ICD-10-CM | POA: Diagnosis not present

## 2022-12-03 DIAGNOSIS — G4733 Obstructive sleep apnea (adult) (pediatric): Secondary | ICD-10-CM | POA: Diagnosis not present

## 2022-12-03 NOTE — Progress Notes (Signed)
Sleep Medicine CONSULT Note    Date:  12/03/2022   ID:  [name], DOB 05-29-1940, MRN [id number]  PCP:  Toma Deiters, MD  Cardiologist: Marjo Bicker, MD   Chief Complaint  Patient presents with   New Patient (Initial Visit)    Obstructive sleep apnea    History of Present Illness:  [name] is a 82 y.o. male who is being seen today for the evaluation of OSA at the request of Dylan School, MD.  This is an 82 year old male with a history of BPH, hyperlipidemia and hypertension.  He also has a history of atrial fibrillation following Dr. Jenene Slicker.  Due to his element of atrial fibrillation she recommended a sleep study.  He underwent home sleep study showing mild obstructive sleep apnea with an AHI of 10.1/h overall but moderate during REM sleep with a REM AHI of 18/h.  He was started on auto CPAP from 4 to 15 cm H2O.  He is now referred for sleep medicine consultation to establish sleep care and treatment of his obstructive sleep apnea.  He is doing well with his PAP device and thinks that he has gotten used to it.  He tolerates the under the nose full face mask and feels the pressure is adequate for the most part but sometimes is too high.  Since going on PAP he feels rested in the am and has no significant daytime sleepiness.  He is having both mouth and nasal dryness nasal dryness.  He does not think that he snores.    Past Medical History:  Diagnosis Date   Arthritis    lower back   Benign prostatic hypertrophy    Elevated PSA    Hypercholesterolemia    Hypertension    Multiple pulmonary nodules     Past Surgical History:  Procedure Laterality Date   APPENDECTOMY     Appendectomy in 1972.     arthroscopic surgery to knee in past     CATARACT EXTRACTION  2015   cut top of right index finer requiring surgery 09/2014 and top of index finger now numb Right 10/01/2014   ETHMOIDECTOMY Right 11/22/2017   Procedure: RIGHT TOTAL ETHMOIDECTOMY;   Surgeon: Newman Pies, MD;  Location: [facility];  Service: ENT;  Laterality: Right;   FRONTAL SINUS EXPLORATION Right 11/22/2017   Procedure: FRONTAL SINUS EXPLORATION;  Surgeon: Newman Pies, MD;  Location: [facility];  Service: ENT;  Laterality: Right;   LUMBAR LAMINECTOMY N/A 11/22/2013   Procedure: MICRODISCECTOMY LUMBAR LAMINECTOMY;  Surgeon: Eldred Manges, MD;  [facility];  Service: Orthopedics;  Laterality: N/A;  L4-5, L5-S1 Decompression   MAXILLARY ANTROSTOMY Right 11/22/2017   Procedure: MAXILLARY ANTROSTOMY WITH TISSUE REMOVAL;  Surgeon: Newman Pies, MD;  Location: [facility];  Service: ENT;  Laterality: Right;   NASAL SEPTOPLASTY W/ TURBINOPLASTY Bilateral 11/22/2017   Procedure: NASAL SEPTOPLASTY WITH BILATERAL TURBINATE REDUCTION;  Surgeon: Newman Pies, MD;  Location: [facility];  Service: ENT;  Laterality: Bilateral;   Scrotal cyst removed in 2002.     SINUS ENDO WITH FUSION Bilateral 11/22/2017   Procedure: SINUS ENDOSCOPY WITH FUSION NAVIGATION;  Surgeon: Newman Pies, MD;  Location: [facility];  Service: ENT;  Laterality: Bilateral;   TONSILLECTOMY     TOTAL KNEE ARTHROPLASTY Left 02/20/2019   Procedure: TOTAL KNEE ARTHROPLASTY;  Surgeon: Ollen Gross, MD;  Location: [facility];  Service: Orthopedics;  Laterality: Left;    Vasectomy in 1977.      Current Medications: Current Meds  Medication Sig   acetaminophen (TYLENOL) 500 MG tablet Take 500-1,000 mg by mouth See admin instructions. Take 2 tablets (1000 mg) by mouth in the morning (scheduled), may take an additional dose in the evening if needed for pain.   apixaban (ELIQUIS) 5 MG TABS tablet Take 1 tablet (5 mg total) by mouth 2 (two) times daily.   atorvastatin (LIPITOR) 20 MG tablet Take 20 mg by mouth every evening.    diltiazem (CARDIZEM CD) 120 MG 24 hr capsule Take 1 capsule (120 mg total) by mouth daily.   Multiple Vitamin (MULTIVITAMIN WITH MINERALS)  TABS tablet Take 1 tablet by mouth daily.   olmesartan (BENICAR) 20 MG tablet Take 1 tablet by mouth daily.   Omega-3 Fatty Acids (FISH OIL) 1200 MG CAPS Take 1,200 mg by mouth daily.   omeprazole (PRILOSEC) 20 MG capsule Take 20 mg by mouth daily.   tadalafil (CIALIS) 5 MG tablet Take 5 mg by mouth daily.   tamsulosin (FLOMAX) 0.4 MG CAPS capsule Take 1 capsule by mouth daily.    Allergies:   Patient has no known allergies.   Social History   Socioeconomic History   Marital status: Married    Spouse name: Not on file   Number of children: 1   Years of education: Not on file   Highest education level: Not on file  Occupational History   Not on file  Tobacco Use   Smoking status: Former    Current packs/day: 0.00    Types: Cigarettes    Quit date: 03/30/1982    Years since quitting: 40.7   Smokeless tobacco: Never  Vaping Use   Vaping status: Never Used  Substance and Sexual Activity   Alcohol use: No   Drug use: No   Sexual activity: Not on file  Other Topics Concern   Not on file  Social History Narrative   Not on file   Social Determinants of Health   Financial Resource Strain: Low Risk  (07/06/2022)   Received from [facility], [facility]   Overall Financial Resource Strain (CARDIA)    Difficulty of Paying Living Expenses: Not hard at all  Food Insecurity: No Food Insecurity (07/06/2022)   Received from [facility], [facility]   Hunger Vital Sign    Worried About Running Out of Food in the Last Year: Never true    Ran Out of Food in the Last Year: Never true  Transportation Needs: No Transportation Needs (07/06/2022)   Received from [facility], [facility]   [facility]    Lack of Transportation (Medical): No    Lack of Transportation (Non-Medical): No  Physical Activity: Not on file  Stress: Not on file  Social Connections: Not on file     Family History:  The patient's family history is not on file.   ROS:   Please see  the history of present illness.    ROS All other systems reviewed and are negative.      No data to display             PHYSICAL EXAM:   VS:  BP 120/80   Pulse 62   Ht 5\' 10"  (1.778 m)   Wt 193 lb 12.8 oz (87.9 kg)   SpO2 94%   BMI 27.81 kg/m    GEN: Well nourished, well developed, in no acute  distress  HEENT: normal  Neck: no JVD, carotid bruits, or masses Cardiac: RRR; no murmurs, rubs, or gallops,no edema.  Intact distal pulses bilaterally.  Respiratory:  clear to auscultation bilaterally, normal work of breathing GI: soft, nontender, nondistended, + BS MS: no deformity or atrophy  Skin: warm and dry, no rash Neuro:  Alert and Oriented x 3, Strength and sensation are intact Psych: euthymic mood, full affect  Wt Readings from Last 3 Encounters:  12/03/22 193 lb 12.8 oz (87.9 kg)  08/05/22 190 lb 6.4 oz (86.4 kg)  02/20/19 190 lb 4.1 oz (86.3 kg)      Studies/Labs Reviewed:   Home sleep study and PAP compliance download  Recent Labs: No results found for requested labs within last 365 days.     ASSESSMENT:    1. OSA (obstructive sleep apnea)   2. Primary hypertension      PLAN:  In order of problems listed above:  OSA - The patient is tolerating PAP therapy well without any problems. The PAP download performed by his DME was personally reviewed and interpreted by me today and showed an AHI of 2.4 /hr on auto CPAP from 4-15 cm H2O with 100% compliance in using more than 4 hours nightly.  The patient has been using and benefiting from PAP use and will continue to benefit from therapy.  -he thinks the pressure is to high at times so I will lower the auto CPAP to 4-13cm H2O -repeat download in 4 weeks -I told him to let me know if the pressure change does not work well for him  Hypertension -BP controlled on exam today -Continue drug management with Cardizem CD 120 mg daily and olmesartan 20 mg daily with as needed refills   Time Spent: 20 minutes  total time of encounter, including 15 minutes spent in face-to-face patient care on the date of this encounter. This time includes coordination of care and counseling regarding above mentioned problem list. Remainder of non-face-to-face time involved reviewing chart documents/testing relevant to the patient encounter and documentation in the medical record. I have independently reviewed documentation from referring provider  Medication Adjustments/Labs and Tests Ordered: Current medicines are reviewed at length with the patient today.  Concerns regarding medicines are outlined above.  Medication changes, Labs and Tests ordered today are listed in the Patient Instructions below.  There are no Patient Instructions on file for this visit.   Signed, Armanda Magic, MD  12/03/2022 3:24 PM    [facility] [address] [place], [place], [place]  [address] Phone: [phone]; Fax: [phone]

## 2022-12-03 NOTE — Patient Instructions (Signed)

## 2022-12-04 ENCOUNTER — Telehealth: Payer: Self-pay | Admitting: *Deleted

## 2022-12-04 DIAGNOSIS — G4733 Obstructive sleep apnea (adult) (pediatric): Secondary | ICD-10-CM

## 2022-12-04 DIAGNOSIS — I10 Essential (primary) hypertension: Secondary | ICD-10-CM

## 2022-12-04 NOTE — Telephone Encounter (Signed)
-----   Message from [name] sent at 12/03/2022  3:28 PM EDT ----- lower the auto CPAP to 4-13cm H2O and get a download in 4 weeks

## 2022-12-04 NOTE — Telephone Encounter (Signed)
Order placed to [facility] via community message. 

## 2022-12-12 ENCOUNTER — Other Ambulatory Visit (HOSPITAL_COMMUNITY): Payer: Self-pay

## 2022-12-27 ENCOUNTER — Other Ambulatory Visit (HOSPITAL_COMMUNITY): Payer: Self-pay

## 2023-01-04 DIAGNOSIS — I480 Paroxysmal atrial fibrillation: Secondary | ICD-10-CM | POA: Diagnosis not present

## 2023-01-04 DIAGNOSIS — I10 Essential (primary) hypertension: Secondary | ICD-10-CM | POA: Diagnosis not present

## 2023-01-04 DIAGNOSIS — N182 Chronic kidney disease, stage 2 (mild): Secondary | ICD-10-CM | POA: Diagnosis not present

## 2023-01-04 DIAGNOSIS — E785 Hyperlipidemia, unspecified: Secondary | ICD-10-CM | POA: Diagnosis not present

## 2023-02-05 ENCOUNTER — Other Ambulatory Visit (HOSPITAL_COMMUNITY): Payer: Self-pay

## 2023-02-06 ENCOUNTER — Other Ambulatory Visit (HOSPITAL_COMMUNITY): Payer: Self-pay

## 2023-02-08 ENCOUNTER — Encounter: Payer: Self-pay | Admitting: Internal Medicine

## 2023-02-08 ENCOUNTER — Ambulatory Visit: Payer: Medicare Other | Attending: Internal Medicine | Admitting: Internal Medicine

## 2023-02-08 ENCOUNTER — Other Ambulatory Visit (HOSPITAL_COMMUNITY): Payer: Self-pay

## 2023-02-08 VITALS — BP 130/72 | HR 56 | Ht 71.0 in | Wt 186.8 lb

## 2023-02-08 DIAGNOSIS — G4733 Obstructive sleep apnea (adult) (pediatric): Secondary | ICD-10-CM | POA: Insufficient documentation

## 2023-02-08 DIAGNOSIS — I351 Nonrheumatic aortic (valve) insufficiency: Secondary | ICD-10-CM | POA: Insufficient documentation

## 2023-02-08 DIAGNOSIS — Z7901 Long term (current) use of anticoagulants: Secondary | ICD-10-CM | POA: Diagnosis not present

## 2023-02-08 DIAGNOSIS — I480 Paroxysmal atrial fibrillation: Secondary | ICD-10-CM

## 2023-02-08 NOTE — Patient Instructions (Signed)
Medication Instructions:  Your physician recommends that you continue on your current medications as directed. Please refer to the Current Medication list given to you today.   Labwork: None  Testing/Procedures: None  Follow-Up: Your physician recommends that you schedule a follow-up appointment in: 1 year. You will receive a reminder call in about months reminding you to schedule your appointment. If you don't receive this call, please contact our office.   Any Other Special Instructions Will Be Listed Below (If Applicable).  Thank you for choosing [facility]!      If you need a refill on your cardiac medications before your next appointment, please call your pharmacy.

## 2023-02-08 NOTE — Progress Notes (Addendum)
Cardiology Office Note  Date: 02/08/2023   ID: KENET VISCARDI, DOB [birth year], MRN [id number]  PCP:  Toma Deiters, MD  Cardiologist:  Marjo Bicker, MD Electrophysiologist:  None    History of Present Illness: [name] is a 82 y.o. male known to have HTN, HLD, new onset A-fib diagnosed in 06/2022 is here for follow-up visit.  Overall doing great, he underwent home sleep study that showed OSA, currently wearing CPAP and following with Dr. Mayford Knife.  Denies having palpitations, presyncope, syncope but he did have a spell in the last 1 week where he felt extremely tired for some time, he did not have palpitations or SOB at that time.  No angina, DOE, orthopnea, PND.  He does have difficulty wearing CPAP.  Instructed him to reach out to Dr. Norris Cross office.  Event monitor at [facility] showed NSVT and no evidence of atrial fibrillation.  He had a few episodes of SVT, brief atrial tachycardia possibly.  No evidence of AV block or pauses.  Less than 1% PAC/PVC burden.  Past Medical History:  Diagnosis Date   Arthritis    lower back   Benign prostatic hypertrophy    Elevated PSA    Hypercholesterolemia    Hypertension    Multiple pulmonary nodules     Past Surgical History:  Procedure Laterality Date   APPENDECTOMY     Appendectomy in 1972.     arthroscopic surgery to knee in past     CATARACT EXTRACTION  2015   cut top of right index finer requiring surgery 09/2014 and top of index finger now numb Right 10/01/2014   ETHMOIDECTOMY Right 11/22/2017   Procedure: RIGHT TOTAL ETHMOIDECTOMY;  Surgeon: Newman Pies, MD;  Location: [facility];  Service: ENT;  Laterality: Right;   FRONTAL SINUS EXPLORATION Right 11/22/2017   Procedure: FRONTAL SINUS EXPLORATION;  Surgeon: Newman Pies, MD;  Location: [facility];  Service: ENT;  Laterality: Right;   LUMBAR LAMINECTOMY N/A 11/22/2013   Procedure: MICRODISCECTOMY LUMBAR LAMINECTOMY;  Surgeon: Eldred Manges, MD;   Location: [facility];  Service: Orthopedics;  Laterality: N/A;  L4-5, L5-S1 Decompression   MAXILLARY ANTROSTOMY Right 11/22/2017   Procedure: MAXILLARY ANTROSTOMY WITH TISSUE REMOVAL;  Surgeon: Newman Pies, MD;  Location: [facility];  Service: ENT;  Laterality: Right;   NASAL SEPTOPLASTY W/ TURBINOPLASTY Bilateral 11/22/2017   Procedure: NASAL SEPTOPLASTY WITH BILATERAL TURBINATE REDUCTION;  Surgeon: Newman Pies, MD;  Location: [facility];  Service: ENT;  Laterality: Bilateral;   Scrotal cyst removed in 2002.     SINUS ENDO WITH FUSION Bilateral 11/22/2017   Procedure: SINUS ENDOSCOPY WITH FUSION NAVIGATION;  Surgeon: Newman Pies, MD;  Location: [facility];  Service: ENT;  Laterality: Bilateral;   TONSILLECTOMY     TOTAL KNEE ARTHROPLASTY Left 02/20/2019   Procedure: TOTAL KNEE ARTHROPLASTY;  Surgeon: Ollen Gross, MD;  Location: [facility];  Service: Orthopedics;  Laterality: Left;    Vasectomy in 1977.      Current Outpatient Medications  Medication Sig Dispense Refill   acetaminophen (TYLENOL) 500 MG tablet Take 500-1,000 mg by mouth See admin instructions. Take 2 tablets (1000 mg) by mouth in the morning (scheduled), may take an additional dose in the evening if needed for pain.     apixaban (ELIQUIS) 5 MG TABS tablet Take 1 tablet (5 mg total) by mouth 2 (two) times daily. 180 tablet 2   atorvastatin (LIPITOR) 20  MG tablet Take 20 mg by mouth every evening.      diltiazem (CARDIZEM CD) 120 MG 24 hr capsule Take 1 capsule (120 mg total) by mouth daily. 90 capsule 2   Multiple Vitamin (MULTIVITAMIN WITH MINERALS) TABS tablet Take 1 tablet by mouth daily.     olmesartan (BENICAR) 20 MG tablet Take 1 tablet by mouth daily.     Omega-3 Fatty Acids (FISH OIL) 1200 MG CAPS Take 1,200 mg by mouth daily.     omeprazole (PRILOSEC) 20 MG capsule Take 20 mg by mouth daily.     tadalafil (CIALIS) 5 MG tablet Take 5 mg by mouth daily.     tamsulosin (FLOMAX) 0.4 MG  CAPS capsule Take 1 capsule by mouth daily.     No current facility-administered medications for this visit.   Allergies:  Patient has no known allergies.   Social History: The patient  reports that he quit smoking about 40 years ago. His smoking use included cigarettes. He has never used smokeless tobacco. He reports that he does not drink alcohol and does not use drugs.   Family History: The patient's family history is not on file.   ROS:  Please see the history of present illness. Otherwise, complete review of systems is positive for none  All other systems are reviewed and negative.   Physical Exam: VS:  There were no vitals taken for this visit., BMI There is no height or weight on file to calculate BMI.  Wt Readings from Last 3 Encounters:  12/03/22 193 lb 12.8 oz (87.9 kg)  08/05/22 190 lb 6.4 oz (86.4 kg)  02/20/19 190 lb 4.1 oz (86.3 kg)    General: Patient appears comfortable at rest. HEENT: Conjunctiva and lids normal, oropharynx clear with moist mucosa. Neck: Supple, no elevated JVP or carotid bruits, no thyromegaly. Lungs: Clear to auscultation, nonlabored breathing at rest. Cardiac: Regular rate and rhythm, no S3 or significant systolic murmur, no pericardial rub. Abdomen: Soft, nontender, no hepatomegaly, bowel sounds present, no guarding or rebound. Extremities: No pitting edema, distal pulses 2+. Skin: Warm and dry. Musculoskeletal: No kyphosis. Neuropsychiatric: Alert and oriented x3, affect grossly appropriate.  Recent Labwork: No results found for requested labs within last 365 days.  No results found for: "CHOL", "TRIG", "HDL", "CHOLHDL", "VLDL", "LDLCALC", "LDLDIRECT"  Assessment and Plan:  Paroxysmal A-fib: Asymptomatic except for 1 fatigue spell that happened in the last 1 week.  Instructed to purchase smart watch with EKG lead functionality or Kardia mobile to assess rhythm if he develops similar symptoms in the future. For now, will continue diltiazem  120 mg once daily and Eliquis 5 mg twice daily.  EKG today showed sinus bradycardia, HR 53 bpm.  No risk of falls and no bleeding complications. Echocardiogram showed normal LVEF, mild MR and trivial AR. TSH was within normal limits. Home sleep study showed mild OSA, currently on CPAP, followed by Dr. Mayford Knife.  OSA on CPAP: Mild OSA on home sleep study, currently is wearing CPAP, instructed him to reach out to Dr. Norris Cross office if he has any questions regarding CPAP.  Trivial AI in 2024: Will obtain repeat echocardiogram in 5 years.  HTN, controlled: Continue olmesartan 20 mg once daily, follows with PCP.  HLD, unknown values: Continue atorvastatin 20 mg nightly, goal LDL less than 100.   I spent a total duration 30 minutes during prior notes, EKG, labs, face-to-face discussion is counseling of his medical condition, pathophysiology, evaluation, management and documenting the findings in the  note.  Medication Adjustments/Labs and Tests Ordered: Current medicines are reviewed at length with the patient today.  Concerns regarding medicines are outlined above.     Disposition:  Follow up  1 year  Signed Blaklee Shores Verne Spurr, MD, 02/08/2023 8:55 AM    [facility] at [facility] [address] [place], [place], [place] [address]

## 2023-02-10 ENCOUNTER — Telehealth: Payer: Self-pay | Admitting: Internal Medicine

## 2023-02-10 ENCOUNTER — Other Ambulatory Visit (HOSPITAL_COMMUNITY): Payer: Self-pay

## 2023-02-10 ENCOUNTER — Other Ambulatory Visit: Payer: Self-pay

## 2023-02-10 MED ORDER — APIXABAN 5 MG PO TABS
5.0000 mg | ORAL_TABLET | Freq: Two times a day (BID) | ORAL | 1 refills | Status: DC
  Filled 2023-02-10: qty 60, 30d supply, fill #0
  Filled 2023-03-08: qty 60, 30d supply, fill #1
  Filled 2023-04-06: qty 60, 30d supply, fill #2
  Filled 2023-05-15: qty 60, 30d supply, fill #3

## 2023-02-10 MED ORDER — DILTIAZEM HCL ER COATED BEADS 120 MG PO CP24
120.0000 mg | ORAL_CAPSULE | Freq: Every day | ORAL | 2 refills | Status: DC
  Filled 2023-02-10 – 2023-03-08 (×2): qty 90, 90d supply, fill #0
  Filled 2023-05-21 – 2023-06-14 (×3): qty 90, 90d supply, fill #1
  Filled 2023-09-06: qty 90, 90d supply, fill #2

## 2023-02-10 NOTE — Telephone Encounter (Signed)
Diltiazem refilled. Routed for Eliquis refill

## 2023-02-10 NOTE — Telephone Encounter (Signed)
Prescription refill request for Eliquis received. Indication: PAF Last office visit: 02/08/23  V Mallipeddi MD Scr: 0.78 on 07/06/22  Epic Age: 82 Weight: 84.7kg  Based on above findings Eliquis 5mg  twice daily is the appropriate dose.  Refill approved.

## 2023-02-10 NOTE — Addendum Note (Signed)
Addended by: Louanna Raw on: 02/10/2023 10:41 AM   Modules accepted: Orders

## 2023-02-10 NOTE — Telephone Encounter (Signed)
*  STAT* If patient is at the pharmacy, call can be transferred to refill team.   1. Which medications need to be refilled? (please list name of each medication and dose if known) apixaban (ELIQUIS) 5 MG TABS tablet ; diltiazem (CARDIZEM CD) 120 MG 24 hr capsule    2. Would you like to learn more about the convenience, safety, & potential cost savings by using the [facility]? Yes      3. Are you open to using the [facility] (Type [facility]. Yes ).   4. Which pharmacy/location (including street and city if local pharmacy) is medication to be sent to? [place] [facility]    5. Do they need a 30 day or 90 day supply? 90

## 2023-03-08 ENCOUNTER — Other Ambulatory Visit (HOSPITAL_COMMUNITY): Payer: Self-pay

## 2023-04-03 DIAGNOSIS — I499 Cardiac arrhythmia, unspecified: Secondary | ICD-10-CM | POA: Diagnosis not present

## 2023-04-03 DIAGNOSIS — R404 Transient alteration of awareness: Secondary | ICD-10-CM | POA: Diagnosis not present

## 2023-04-03 DIAGNOSIS — E785 Hyperlipidemia, unspecified: Secondary | ICD-10-CM | POA: Diagnosis not present

## 2023-04-03 DIAGNOSIS — I10 Essential (primary) hypertension: Secondary | ICD-10-CM | POA: Diagnosis not present

## 2023-04-03 DIAGNOSIS — Z87891 Personal history of nicotine dependence: Secondary | ICD-10-CM | POA: Diagnosis not present

## 2023-04-03 DIAGNOSIS — R55 Syncope and collapse: Secondary | ICD-10-CM | POA: Diagnosis not present

## 2023-04-03 DIAGNOSIS — Z743 Need for continuous supervision: Secondary | ICD-10-CM | POA: Diagnosis not present

## 2023-04-03 DIAGNOSIS — I4891 Unspecified atrial fibrillation: Secondary | ICD-10-CM | POA: Diagnosis not present

## 2023-04-03 DIAGNOSIS — R6889 Other general symptoms and signs: Secondary | ICD-10-CM | POA: Diagnosis not present

## 2023-04-03 DIAGNOSIS — R1110 Vomiting, unspecified: Secondary | ICD-10-CM | POA: Diagnosis not present

## 2023-04-06 ENCOUNTER — Ambulatory Visit: Payer: Medicare Other | Attending: Nurse Practitioner | Admitting: Nurse Practitioner

## 2023-04-06 ENCOUNTER — Other Ambulatory Visit: Payer: Self-pay

## 2023-04-06 ENCOUNTER — Other Ambulatory Visit (HOSPITAL_COMMUNITY): Payer: Self-pay

## 2023-04-06 ENCOUNTER — Encounter: Payer: Self-pay | Admitting: Nurse Practitioner

## 2023-04-06 ENCOUNTER — Other Ambulatory Visit (HOSPITAL_BASED_OUTPATIENT_CLINIC_OR_DEPARTMENT_OTHER): Payer: Self-pay

## 2023-04-06 VITALS — BP 138/74 | HR 62 | Ht 70.0 in | Wt 189.2 lb

## 2023-04-06 DIAGNOSIS — R55 Syncope and collapse: Secondary | ICD-10-CM

## 2023-04-06 DIAGNOSIS — G4733 Obstructive sleep apnea (adult) (pediatric): Secondary | ICD-10-CM | POA: Diagnosis not present

## 2023-04-06 DIAGNOSIS — I10 Essential (primary) hypertension: Secondary | ICD-10-CM

## 2023-04-06 DIAGNOSIS — I480 Paroxysmal atrial fibrillation: Secondary | ICD-10-CM

## 2023-04-06 DIAGNOSIS — E785 Hyperlipidemia, unspecified: Secondary | ICD-10-CM

## 2023-04-06 NOTE — Progress Notes (Signed)
 Cardiology Office Note:  .   Date:  04/06/2023  ID:  [name], DOB 06-17-1940, MRN [id number] PCP: [name] LABOR, MD  [facility] Providers Cardiologist:  Dylan SHAUNNA Maywood, MD    History of Present Illness: Dylan   [name] is a 83 y.o. male with a PMH of PAF, HTN, HLD, OSA on CPAP, trivial AI, and hx of syncope, who presents today for scheduled follow-up. New onset A-fib diagnosed in April 2024. Followed by Dr. Shlomo who is managing his OSA.   Previous monitor arranged by [name] revealed NSVT, no A-fib. Few episodes of SVT, felt to be possibly brief A-tach. No pauses or high degree AV blocks noted, brief PAC/PVC burden.   Last seen by Dr. Maywood on February 08, 2023. Was doing well at the time.   Recent ED visit on April 03, 2023 for syncope.  He was seated at the dinner table and felt lightheaded and had subsequent syncopal episode, denied any seizure-like activity.  Patient stated he had had an additional episode earlier this year.  Did note some nausea, overall was feeling well.  Workup was overall unremarkable.  Patient reported he took Cialis earlier in the day as this was prescribed by his PCP to prevent Alzheimer's.  This is suspected to be the likely etiology for his symptoms.  Orthostatic vital signs were negative.  Cialis was d/c, given instructions to hold BP medications.   Today he presents for follow-up with his wife. His wife states this is his second syncopal episode within the past year.  During the second episode, his blood pressure was checked at home and was 70/40, wife called 911.  Says his past episode happened last June and 2024, heart rate was elevated at the time of his low blood pressure.  Denies any chest pain surrounding event, although admits to the last few days having tingling sensation of his left upper chest/shoulder that has not recurred since.  He says he did not feel good prior to his most recent syncopal episode.  He is not taking Cialis  anymore per his report. Denies any chest pain, shortness of breath, palpitations, presyncope, dizziness, orthopnea, PND, swelling or significant weight changes, acute bleeding, or claudication.  ROS: Negative. See HPI.  Studies Reviewed: Dylan    EKG:  EKG Interpretation Date/Time:  Tuesday April 06 2023 11:26:54 EST Ventricular Rate:  62 PR Interval:  158 QRS Duration:  96 QT Interval:  372 QTC Calculation: 377 R Axis:   5  Text Interpretation: Normal sinus rhythm Normal ECG When compared with ECG of 08-Feb-2023 09:07, No significant change was found Confirmed by [name] [phone]) on 04/06/2023 11:33:18 AM   Echocardiogram 08/2022: 1. Left ventricular ejection fraction, by estimation, is 55 to 60%. The  left ventricle has normal function. The left ventricle has no regional  wall motion abnormalities. There is mild left ventricular hypertrophy.  Left ventricular diastolic parameters  are consistent with Grade I diastolic dysfunction (impaired relaxation).   2. Right ventricular systolic function is normal. The right ventricular  size is normal. There is normal pulmonary artery systolic pressure. The estimated right ventricular systolic pressure is 23.6 mmHg.   3. Left atrial size was mildly dilated.   4. The mitral valve is mildly degenerative. Mild mitral valve  regurgitation.   5. The aortic valve is tricuspid. Aortic valve regurgitation is trivial.  Aortic valve sclerosis is present, with no evidence of aortic valve  stenosis. Aortic valve mean gradient measures  5.0 mmHg.   6. The inferior vena cava is normal in size with greater than 50%  respiratory variability, suggesting right atrial pressure of 3 mmHg.   Comparison(s): No prior Echocardiogram.  Cardiac monitor [name]) 2024:  NSVT, no A-fib. Few episodes of SVT, felt to be possibly brief A-tach. No pauses or high degree AV blocks noted, brief PAC/PVC burden.   Risk Assessment/Calculations:    STOP-Bang Score:   5  {  Physical Exam:   VS:  BP 138/74   Pulse 62   Ht 5' 10 (1.778 m)   Wt 189 lb 3.2 oz (85.8 kg)   SpO2 99%   BMI 27.15 kg/m    Wt Readings from Last 3 Encounters:  04/06/23 189 lb 3.2 oz (85.8 kg)  02/08/23 186 lb 12.8 oz (84.7 kg)  12/03/22 193 lb 12.8 oz (87.9 kg)    GEN: Well nourished, well developed in no acute distress NECK: No JVD; No carotid bruits CARDIAC: S1/S2, RRR, no murmurs, rubs, gallops RESPIRATORY:  Clear to auscultation without rales, wheezing or rhonchi  ABDOMEN: Soft, non-tender, non-distended EXTREMITIES:  No edema; No deformity   ASSESSMENT AND PLAN: .    Syncope Etiology most likely related to taking Cialis, was hypotensive during episode and pt states he had taken medication earlier that day prior to event.  Denies any recurrent episodes since then. Denies any CP surrounding event. Discussed/reviewed past monitor from [name] and Echo - results overall unremarkable. Denies any palpitations. Came to shared medical decision with patient that we will hold off monitor or Echo at this time, may need to consider at next office visit. Previous orthostatics at [name] negative, denies any orthostatic symptoms.  Will discontinue Cialis.  No other medication changes at this time. Discussed to avoid driving for at least 6 months. Care and ED precautions discussed.   PAF Denies any tachycardia or palpitations.  He is in sinus rhythm on exam today.  Continue diltiazem  continue Eliquis  for stroke prevention.  He is on appropriate dosage.   HTN BP stable and at goal. Discussed to monitor BP at home at least 2 hours after medications and sitting for 5-10 minutes. Continue olmesartan. Heart healthy diet and regular cardiovascular exercise encouraged.   HLD  LDL 80 in July 2024. Continue atorvastatin . Heart healthy diet and regular cardiovascular exercise encouraged.   OSA on CPAP Encouraged continued compliance.    Dispo: Follow-up with me/APP in 4-6  weeks or sooner if anything changes.   Signed, Almarie Crate, NP

## 2023-04-06 NOTE — Patient Instructions (Addendum)
 Medication Instructions:  Your physician has recommended you make the following change in your medication:  Please discontinue CIALIS   Labwork: None   Testing/Procedures: None   Follow-Up: Your physician recommends that you schedule a follow-up appointment in: 4-6 weeks   Any Other Special Instructions Will Be Listed Below (If Applicable).  If you need a refill on your cardiac medications before your next appointment, please call your pharmacy.

## 2023-04-12 DIAGNOSIS — I10 Essential (primary) hypertension: Secondary | ICD-10-CM | POA: Diagnosis not present

## 2023-04-12 DIAGNOSIS — I480 Paroxysmal atrial fibrillation: Secondary | ICD-10-CM | POA: Diagnosis not present

## 2023-04-12 DIAGNOSIS — E785 Hyperlipidemia, unspecified: Secondary | ICD-10-CM | POA: Diagnosis not present

## 2023-04-12 DIAGNOSIS — R55 Syncope and collapse: Secondary | ICD-10-CM | POA: Diagnosis not present

## 2023-04-12 DIAGNOSIS — N182 Chronic kidney disease, stage 2 (mild): Secondary | ICD-10-CM | POA: Diagnosis not present

## 2023-04-12 DIAGNOSIS — Z0000 Encounter for general adult medical examination without abnormal findings: Secondary | ICD-10-CM | POA: Diagnosis not present

## 2023-04-21 ENCOUNTER — Telehealth: Payer: Self-pay | Admitting: *Deleted

## 2023-04-21 ENCOUNTER — Encounter: Payer: Self-pay | Admitting: Cardiology

## 2023-04-21 NOTE — Telephone Encounter (Signed)
Received message from echo adm assist that the patient's wife wanted to schedule the patient with Dr. Lynnette Caffey for a second opinion visit.   I called Mrs. Trick (DPR).  She said that her Aunt who lives with them had a heart valve surgery two years ago and they would prefer to switch to Dr. Lynnette Caffey from Dr. Jenene Slicker.     I adv I would send the providers a note and if they both agree we can switch him to Dr. Lynnette Caffey and get him scheduled.  She does not mind driving to [facility] and is grateful for assistance.

## 2023-04-22 NOTE — Telephone Encounter (Signed)
Okay per both providers to switch from Dr. Jenene Slicker to Dr. Lynnette Caffey.  Informed patient via mychart and will arrange next appointment with MD in [place].

## 2023-05-02 NOTE — Progress Notes (Signed)
 Cardiology Office Note:   Date:  05/06/2023  ID:  [name], DOB [birth year], MRN [id number] PCP:  Orpha Yancey LABOR, MD  [facility] Providers Cardiologist:  Wendel Haws, MD Referring MD: Orpha Yancey LABOR, MD  Chief Complaint/Reason for Referral:  Cardiology follow-up ASSESSMENT:    1. Syncope, unspecified syncope type   2. Paroxysmal A-fib (HCC)   3. Secondary hypercoagulable state (HCC)   4. Diastolic dysfunction   5. Primary hypertension   6. Dyslipidemia   7. OSA on CPAP   8. BMI 26.0-26.9,adult     PLAN:   In order of problems listed above: Syncope: Cialis was discontinued.  Obtain monitor.  Echocardiogram recently was reassuring.  If monitor is reassuring then I think this likely represents a vagally mediated mechanism.  Check CBC today. Paroxysmal atrial fibrillation: Continue Eliquis  and diltiazem .   Secondary hypercoagulable state: Continue Eliquis .  Check CBC today. Diastolic dysfunction: Continue Benicar.  Continue diltiazem .  Consider changing to beta-blocker and starting SGLT2 inhibitor and spironolactone in the future. Dyslipidemia: I do not see an indication for intensive lipid-lowering in this individual.  LDL in July 2024 was 80.  Check lipid panel and LFTs today.  Will defer LP(a) testing in this individual of advanced age. Obstructive sleep apnea: Continue CPAP. Elevated BMI: Diet and exercise modification.            Dispo:  Return in about 3 months (around 08/03/2023).      Medication Adjustments/Labs and Tests Ordered: Current medicines are reviewed at length with the patient today.  Concerns regarding medicines are outlined above.  The following changes have been made:  no change   Labs/tests ordered: Orders Placed This Encounter  Procedures   Lipid panel   Hepatic function panel   CBC   LONG TERM MONITOR (3-14 DAYS)    Medication Changes: No orders of the defined types were placed in this encounter.   Current medicines are  reviewed at length with the patient today.  The patient does not have concerns regarding medicines.  I spent 36 minutes reviewing all clinical data during and prior to this visit including all relevant imaging studies, laboratories, clinical information from other health systems and prior notes from both Cardiology and other specialties, interviewing the patient, conducting a complete physical examination, and coordinating care in order to formulate a comprehensive and personalized evaluation and treatment plan.   History of Present Illness:      FOCUSED PROBLEM LIST:   Paroxysmal atrial fibrillation CV 2 score of 3 On apixaban  Diastolic dysfunction G1 DD, EF 55 to 60%, mild MR, trivial AI TTE June 2024 Hypertension Hyperlipidemia Obstructive sleep apnea On CPAP CKD stage IIIa BMI 26  2/25: The patient is an 83 year old male with the above listed medical problems here for cardiology follow-up.  He was last seen in November and he is doing well aside for 1 episode of fatigue.  His blood pressure is well-controlled.  He was seen in the emergency department in January due to syncope.  Apparently he was at the dinner table and he developed lightheadedness and then a syncopal episode.  There was no reported seizure-like activity.  This has happened before.  He reported some nausea at that time as well.  His labs including troponins were negative.  His EKG was reassuring.  He was told to hold his antihypertensives he was discharged home.  He was seen by cardiology a few days later.  His Cialis was discontinued.  The patient tells me  that this happened also in June.  Both times he felt nauseated prior to the episode.  In June he noticed his heart rate was quite high before he felt lightheaded.  In January his heart rate was low before his blood pressure bottomed out.  He feels fine otherwise.  He denies any shortness of breath or exertional angina.  He does report daily palpitations and his heart rate  does increase to the 110s at times.  He denies any peripheral edema or paroxysmal nocturnal dyspnea.  He is compliant with his CPAP but does not like wearing it.  He has had no severe bleeding or bruising.          Current Medications: Current Meds  Medication Sig   acetaminophen  (TYLENOL ) 500 MG tablet Take 500-1,000 mg by mouth See admin instructions. Take 2 tablets (1000 mg) by mouth in the morning (scheduled), may take an additional dose in the evening if needed for pain.   apixaban  (ELIQUIS ) 5 MG TABS tablet Take 1 tablet (5 mg total) by mouth 2 (two) times daily.   atorvastatin  (LIPITOR) 20 MG tablet Take 20 mg by mouth every evening.    diltiazem  (CARDIZEM  CD) 120 MG 24 hr capsule Take 1 capsule (120 mg total) by mouth daily.   midodrine (PROAMATINE) 5 MG tablet SMARTSIG:1 Tablet(s) By Mouth   Multiple Vitamin (MULTIVITAMIN WITH MINERALS) TABS tablet Take 1 tablet by mouth daily.   olmesartan (BENICAR) 20 MG tablet Take 1 tablet by mouth daily.   Omega-3 Fatty Acids (FISH OIL) 1200 MG CAPS Take 1,200 mg by mouth daily.   omeprazole (PRILOSEC) 20 MG capsule Take 20 mg by mouth daily.     Review of Systems:   Please see the history of present illness.    All other systems reviewed and are negative.     EKGs/Labs/Other Test Reviewed:   EKG: EKG from January 2025 demonstrates normal sinus rhythm  EKG Interpretation Date/Time:    Ventricular Rate:    PR Interval:    QRS Duration:    QT Interval:    QTC Calculation:   R Axis:      Text Interpretation:           Risk Assessment/Calculations:    CHA2DS2-VASc Score = 3  This indicates a 3.2% annual risk of stroke. The patient's score is based upon: CHF History: 0 HTN History: 1 Diabetes History: 0 Stroke History: 0 Vascular Disease History: 0 Age Score: 2 Gender Score: 0    STOP-Bang Score:  5       Physical Exam:   VS:  BP 132/62   Pulse (!) 58   Ht 5' 8 (1.727 m)   Wt 187 lb 3.2 oz (84.9 kg)   SpO2 95%    BMI 28.46 kg/m        Wt Readings from Last 3 Encounters:  05/06/23 187 lb 3.2 oz (84.9 kg)  04/06/23 189 lb 3.2 oz (85.8 kg)  02/08/23 186 lb 12.8 oz (84.7 kg)      GENERAL:  No apparent distress, AOx3 HEENT:  No carotid bruits, +2 carotid impulses, no scleral icterus CAR: RRR no murmurs, gallops, rubs, or thrills RES:  Clear to auscultation bilaterally ABD:  Soft, nontender, nondistended, positive bowel sounds x 4 VASC:  +2 radial pulses, +2 carotid pulses NEURO:  CN 2-12 grossly intact; motor and sensory grossly intact PSYCH:  No active depression or anxiety EXT:  No edema, ecchymosis, or cyanosis  Signed, Cornella Emmer K Blayton Huttner, MD  05/06/2023 11:00 AM    [facility] [address] [place], [place], [place]  [address] Phone: [phone]; Fax: [phone]   Note:  This document was prepared using Dragon voice recognition software and may include unintentional dictation errors.

## 2023-05-03 DIAGNOSIS — M85851 Other specified disorders of bone density and structure, right thigh: Secondary | ICD-10-CM | POA: Diagnosis not present

## 2023-05-03 DIAGNOSIS — M810 Age-related osteoporosis without current pathological fracture: Secondary | ICD-10-CM | POA: Diagnosis not present

## 2023-05-05 ENCOUNTER — Telehealth: Payer: Self-pay

## 2023-05-05 DIAGNOSIS — I480 Paroxysmal atrial fibrillation: Secondary | ICD-10-CM

## 2023-05-05 DIAGNOSIS — G4733 Obstructive sleep apnea (adult) (pediatric): Secondary | ICD-10-CM

## 2023-05-05 DIAGNOSIS — I10 Essential (primary) hypertension: Secondary | ICD-10-CM

## 2023-05-05 NOTE — Telephone Encounter (Signed)
 Left VM, per DPR. Notified patient that Mask Fitting order has been placed through [employer]. Left callback number for questions and/or concerns.

## 2023-05-06 ENCOUNTER — Encounter: Payer: Self-pay | Admitting: Internal Medicine

## 2023-05-06 ENCOUNTER — Ambulatory Visit: Payer: Medicare Other | Attending: Internal Medicine | Admitting: Internal Medicine

## 2023-05-06 ENCOUNTER — Ambulatory Visit (INDEPENDENT_AMBULATORY_CARE_PROVIDER_SITE_OTHER): Payer: Medicare Other

## 2023-05-06 VITALS — BP 132/62 | HR 58 | Ht 68.0 in | Wt 187.2 lb

## 2023-05-06 DIAGNOSIS — E785 Hyperlipidemia, unspecified: Secondary | ICD-10-CM

## 2023-05-06 DIAGNOSIS — I480 Paroxysmal atrial fibrillation: Secondary | ICD-10-CM

## 2023-05-06 DIAGNOSIS — I5189 Other ill-defined heart diseases: Secondary | ICD-10-CM

## 2023-05-06 DIAGNOSIS — G4733 Obstructive sleep apnea (adult) (pediatric): Secondary | ICD-10-CM | POA: Diagnosis not present

## 2023-05-06 DIAGNOSIS — D6869 Other thrombophilia: Secondary | ICD-10-CM

## 2023-05-06 DIAGNOSIS — R55 Syncope and collapse: Secondary | ICD-10-CM

## 2023-05-06 DIAGNOSIS — I10 Essential (primary) hypertension: Secondary | ICD-10-CM | POA: Diagnosis not present

## 2023-05-06 DIAGNOSIS — Z6826 Body mass index (BMI) 26.0-26.9, adult: Secondary | ICD-10-CM

## 2023-05-06 NOTE — Progress Notes (Unsigned)
 7 day Zio XT applied in office

## 2023-05-06 NOTE — Patient Instructions (Signed)
 Medication Instructions:  Your physician recommends that you continue on your current medications as directed. Please refer to the Current Medication list given to you today.  *If you need a refill on your cardiac medications before your next appointment, please call your pharmacy*  Lab Work: TODAY: CBC, Lipid panel, LFTs If you have labs (blood work) drawn today and your tests are completely normal, you will receive your results only by: MyChart Message (if you have MyChart) OR A paper copy in the mail If you have any lab test that is abnormal or we need to change your treatment, we will call you to review the results.  Testing/Procedures: Your physician has requested that you wear a Zio heart monitor for 7 days. Please allow 2 weeks after returning the heart monitor before our office calls you with the results.   Follow-Up: At [facility], you and your health needs are our priority.  As part of our continuing mission to provide you with exceptional heart care, we have created designated Provider Care Teams.  These Care Teams include your primary Cardiologist (physician) and Advanced Practice Providers (APPs -  Physician Assistants and Nurse Practitioners) who all work together to provide you with the care you need, when you need it.  Your next appointment:   3 month(s)  The format for your next appointment:   In Person  Provider:   Arun K Thukkani, MD {  Other Instructions [name]- Long Term Monitor Instructions     Your physician has requested you wear a ZIO patch monitor for 7 days.  This is a single patch monitor. [employer] supplies one patch monitor per enrollment. Additional  stickers are not available. Please do not apply patch if you will be having a Nuclear Stress Test,  Echocardiogram, Cardiac CT, MRI, or Chest Xray during the period you would be wearing the  monitor. The patch cannot be worn during these tests. You cannot remove and re-apply the  ZIO XT patch monitor.   Your ZIO patch monitor will be mailed 3 day USPS to your address on file. It may take 3-5 days  to receive your monitor after you have been enrolled.  Once you have received your monitor, please review the enclosed instructions. Your monitor  has already been registered assigning a specific monitor serial # to you.     Billing and Patient Assistance Program Information     We have supplied [employer] with any of your insurance information on file for billing purposes.  [employer] offers a sliding scale Patient Assistance Program for patients that do not have  insurance, or whose insurance does not completely cover the cost of the ZIO monitor.  You must apply for the Patient Assistance Program to qualify for this discounted rate.  To apply, please call [employer] at [phone], select option 4, select option 2, ask to apply for  Patient Assistance Program. Meredeth will ask your household income, and how many people  are in your household. They will quote your out-of-pocket cost based on that information.  [employer] will also be able to set up a 64-month, interest-free payment plan if needed.     Applying the monitor     Shave hair from upper left chest.  Hold abrader disc by orange tab. Rub abrader in 40 strokes over the upper left chest as  indicated in your monitor instructions.  Clean area with 4 enclosed alcohol pads. Let dry.  Apply patch as indicated in monitor instructions. Patch will be placed under  collarbone on left  side of chest with arrow pointing upward.  Rub patch adhesive wings for 2 minutes. Remove white label marked 1. Remove the white  label marked 2. Rub patch adhesive wings for 2 additional minutes.  While looking in a mirror, press and release button in center of patch. A small green light will  flash 3-4 times. This will be your only indicator that the monitor has been turned on.  Do not shower for the first 24 hours. You may shower after the first 24 hours.  Press the  button if you feel a symptom. You will hear a small click. Record Date, Time and  Symptom in the Patient Logbook.  When you are ready to remove the patch, follow instructions on the last 2 pages of Patient  Logbook. Stick patch monitor onto the last page of Patient Logbook.  Place Patient Logbook in the blue and white box. Use locking tab on box and tape box closed  securely. The blue and white box has prepaid postage on it. Please place it in the mailbox as  soon as possible. Your physician should have your test results approximately 7 days after the  monitor has been mailed back to [facility].  Call [facility] Customer Care at [phone] if you have questions regarding  your ZIO XT patch monitor. Call them immediately if you see an orange light blinking on your  monitor.  If your monitor falls off in less than 4 days, contact our Monitor department at [phone].  If your monitor becomes loose or falls off after 4 days call [employer] at [phone] for  suggestions on securing your monitor.       1st Floor: - Lobby - Registration  - Pharmacy  - Lab - Cafe  2nd Floor: - PV Lab - Diagnostic Testing (echo, CT, nuclear med)  3rd Floor: - Vacant  4th Floor: - TCTS (cardiothoracic surgery) - AFib Clinic - Structural Heart Clinic - Vascular Surgery  - Vascular Ultrasound  5th Floor: - HeartCare Cardiology (general and EP) - Clinical Pharmacy for coumadin, hypertension, lipid, weight-loss medications, and med management appointments    Valet parking services will be available as well.

## 2023-05-07 ENCOUNTER — Encounter: Payer: Self-pay | Admitting: Internal Medicine

## 2023-05-07 LAB — LIPID PANEL
Chol/HDL Ratio: 3.1 {ratio} (ref 0.0–5.0)
Cholesterol, Total: 148 mg/dL (ref 100–199)
HDL: 48 mg/dL (ref >39)
LDL Chol Calc (NIH): 78 mg/dL (ref 0–99)
Triglycerides: 126 mg/dL (ref 0–149)
VLDL Cholesterol Cal: 22 mg/dL (ref 5–40)

## 2023-05-07 LAB — CBC
Hematocrit: 43.7 % (ref 37.5–51.0)
Hemoglobin: 14.8 g/dL (ref 13.0–17.7)
MCH: 34.7 pg — ABNORMAL HIGH (ref 26.6–33.0)
MCHC: 33.9 g/dL (ref 31.5–35.7)
MCV: 103 fL — ABNORMAL HIGH (ref 79–97)
Platelets: 194 10*3/uL (ref 150–450)
RBC: 4.26 x10E6/uL (ref 4.14–5.80)
RDW: 12.3 % (ref 11.6–15.4)
WBC: 7.7 10*3/uL (ref 3.4–10.8)

## 2023-05-07 LAB — HEPATIC FUNCTION PANEL
ALT: 19 [IU]/L (ref 0–44)
AST: 20 [IU]/L (ref 0–40)
Albumin: 4.2 g/dL (ref 3.7–4.7)
Alkaline Phosphatase: 55 [IU]/L (ref 44–121)
Bilirubin Total: 0.7 mg/dL (ref 0.0–1.2)
Bilirubin, Direct: 0.22 mg/dL (ref 0.00–0.40)
Total Protein: 6.5 g/dL (ref 6.0–8.5)

## 2023-05-10 ENCOUNTER — Ambulatory Visit: Payer: Medicare Other | Admitting: Nurse Practitioner

## 2023-05-14 ENCOUNTER — Telehealth: Payer: Self-pay

## 2023-05-14 DIAGNOSIS — G4733 Obstructive sleep apnea (adult) (pediatric): Secondary | ICD-10-CM

## 2023-05-14 DIAGNOSIS — I480 Paroxysmal atrial fibrillation: Secondary | ICD-10-CM

## 2023-05-14 DIAGNOSIS — I5189 Other ill-defined heart diseases: Secondary | ICD-10-CM

## 2023-05-14 DIAGNOSIS — R55 Syncope and collapse: Secondary | ICD-10-CM

## 2023-05-14 DIAGNOSIS — D6869 Other thrombophilia: Secondary | ICD-10-CM

## 2023-05-14 DIAGNOSIS — I10 Essential (primary) hypertension: Secondary | ICD-10-CM

## 2023-05-14 NOTE — Telephone Encounter (Signed)
Order for Mask Fitting sent to [employer] 05/14/23

## 2023-05-15 ENCOUNTER — Other Ambulatory Visit (HOSPITAL_COMMUNITY): Payer: Self-pay

## 2023-05-19 DIAGNOSIS — R55 Syncope and collapse: Secondary | ICD-10-CM | POA: Diagnosis not present

## 2023-05-19 DIAGNOSIS — I480 Paroxysmal atrial fibrillation: Secondary | ICD-10-CM | POA: Diagnosis not present

## 2023-05-20 ENCOUNTER — Encounter: Payer: Self-pay | Admitting: Internal Medicine

## 2023-05-22 ENCOUNTER — Other Ambulatory Visit (HOSPITAL_COMMUNITY): Payer: Self-pay

## 2023-05-25 ENCOUNTER — Other Ambulatory Visit: Payer: Self-pay

## 2023-05-26 ENCOUNTER — Encounter: Payer: Self-pay | Admitting: Internal Medicine

## 2023-05-28 ENCOUNTER — Other Ambulatory Visit: Payer: Self-pay

## 2023-06-03 ENCOUNTER — Other Ambulatory Visit: Payer: Self-pay | Admitting: Internal Medicine

## 2023-06-03 DIAGNOSIS — I480 Paroxysmal atrial fibrillation: Secondary | ICD-10-CM

## 2023-06-03 NOTE — Telephone Encounter (Signed)
 Prescription refill request for Eliquis received. Indication: Afib  Last office visit: 05/06/23 Lynnette Caffey)  Scr: 1.25 (04/03/23)  Age: 83 Weight: 84.9kg  Appropriate dose. Refill sent.

## 2023-06-04 ENCOUNTER — Other Ambulatory Visit: Payer: Self-pay

## 2023-06-09 ENCOUNTER — Other Ambulatory Visit: Payer: Self-pay

## 2023-06-14 ENCOUNTER — Other Ambulatory Visit (HOSPITAL_COMMUNITY): Payer: Self-pay

## 2023-06-15 ENCOUNTER — Other Ambulatory Visit (HOSPITAL_COMMUNITY): Payer: Self-pay

## 2023-06-15 ENCOUNTER — Other Ambulatory Visit: Payer: Self-pay

## 2023-07-15 DIAGNOSIS — N182 Chronic kidney disease, stage 2 (mild): Secondary | ICD-10-CM | POA: Diagnosis not present

## 2023-07-15 DIAGNOSIS — E785 Hyperlipidemia, unspecified: Secondary | ICD-10-CM | POA: Diagnosis not present

## 2023-07-15 DIAGNOSIS — I480 Paroxysmal atrial fibrillation: Secondary | ICD-10-CM | POA: Diagnosis not present

## 2023-07-15 DIAGNOSIS — I10 Essential (primary) hypertension: Secondary | ICD-10-CM | POA: Diagnosis not present

## 2023-07-15 DIAGNOSIS — Z0000 Encounter for general adult medical examination without abnormal findings: Secondary | ICD-10-CM | POA: Diagnosis not present

## 2023-07-27 DIAGNOSIS — R918 Other nonspecific abnormal finding of lung field: Secondary | ICD-10-CM | POA: Diagnosis not present

## 2023-07-27 DIAGNOSIS — E785 Hyperlipidemia, unspecified: Secondary | ICD-10-CM | POA: Diagnosis not present

## 2023-07-27 DIAGNOSIS — J329 Chronic sinusitis, unspecified: Secondary | ICD-10-CM | POA: Diagnosis not present

## 2023-07-27 DIAGNOSIS — Z885 Allergy status to narcotic agent status: Secondary | ICD-10-CM | POA: Diagnosis not present

## 2023-07-27 DIAGNOSIS — R55 Syncope and collapse: Secondary | ICD-10-CM | POA: Diagnosis not present

## 2023-07-27 DIAGNOSIS — R079 Chest pain, unspecified: Secondary | ICD-10-CM | POA: Diagnosis not present

## 2023-07-27 DIAGNOSIS — R9431 Abnormal electrocardiogram [ECG] [EKG]: Secondary | ICD-10-CM | POA: Diagnosis not present

## 2023-07-27 DIAGNOSIS — R0989 Other specified symptoms and signs involving the circulatory and respiratory systems: Secondary | ICD-10-CM | POA: Diagnosis not present

## 2023-07-27 DIAGNOSIS — J0140 Acute pansinusitis, unspecified: Secondary | ICD-10-CM | POA: Diagnosis not present

## 2023-07-27 DIAGNOSIS — I10 Essential (primary) hypertension: Secondary | ICD-10-CM | POA: Diagnosis not present

## 2023-07-27 DIAGNOSIS — I4891 Unspecified atrial fibrillation: Secondary | ICD-10-CM | POA: Diagnosis not present

## 2023-07-27 DIAGNOSIS — R110 Nausea: Secondary | ICD-10-CM | POA: Diagnosis not present

## 2023-07-27 DIAGNOSIS — R109 Unspecified abdominal pain: Secondary | ICD-10-CM | POA: Diagnosis not present

## 2023-07-27 DIAGNOSIS — Z87891 Personal history of nicotine dependence: Secondary | ICD-10-CM | POA: Diagnosis not present

## 2023-08-12 NOTE — Progress Notes (Signed)
 Cardiology Office Note:   Date:  08/16/2023  ID:  [name], DOB [birth year], MRN [id number] PCP:  Veda Gerald, MD  [facility] Providers Cardiologist:  Alyssa Backbone, MD Referring MD: Veda Gerald, MD  Chief Complaint/Reason for Referral:  Cardiology follow-up ASSESSMENT:    1. Syncope, unspecified syncope type   2. Paroxysmal A-fib (HCC)   3. Secondary hypercoagulable state (HCC)   4. Diastolic dysfunction   5. Dyslipidemia   6. OSA on CPAP   7. BMI 26.0-26.9,adult      PLAN:   In order of problems listed above: Syncope: Reassuring cardiac evaluation thus far.  Likely a vasovagal vagal mediated mechanism.   Paroxysmal atrial fibrillation: Continue Eliquis  5 mg twice daily, change diltiazem  to Toprol as below. Secondary hypercoagulable state: Continue Eliquis  5 mg twice daily  Diastolic dysfunction: Continue home losartan 20 mg daily; stop diltiazem  120 mg daily start Toprol 50 mg every afternoon; start Jardiance  10 mg daily.   Dyslipidemia: I do not see an indication for intensive lipid-lowering in this individual.  LDL was 78 recently.   Obstructive sleep apnea: Continue CPAP. Elevated BMI: Diet and exercise modification.            Dispo:  Return in about 6 months (around 02/16/2024).      Medication Adjustments/Labs and Tests Ordered: Current medicines are reviewed at length with the patient today.  Concerns regarding medicines are outlined above.  The following changes have been made:  no change   Labs/tests ordered: No orders of the defined types were placed in this encounter.   Medication Changes: No orders of the defined types were placed in this encounter.   Current medicines are reviewed at length with the patient today.  The patient does not have concerns regarding medicines.  I spent 38 minutes reviewing all clinical data during and prior to this visit including all relevant imaging studies, laboratories, clinical information from  other health systems and prior notes from both Cardiology and other specialties, interviewing the patient, conducting a complete physical examination, and coordinating care in order to formulate a comprehensive and personalized evaluation and treatment plan.   History of Present Illness:      FOCUSED PROBLEM LIST:   Paroxysmal atrial fibrillation CV 2 score of 3 On apixaban  Diastolic dysfunction G1 DD, EF 55 to 60%, mild MR, trivial AI TTE June 2024 Hypertension Hyperlipidemia Obstructive sleep apnea On CPAP CKD stage IIIa BMI 26  2/25: The patient is an 83 year old male with the above listed medical problems here for cardiology follow-up.  He was last seen in November and he is doing well aside for 1 episode of fatigue.  His blood pressure is well-controlled.  He was seen in the emergency department in January due to syncope.  Apparently he was at the dinner table and he developed lightheadedness and then a syncopal episode.  There was no reported seizure-like activity.  This has happened before.  He reported some nausea at that time as well.  His labs including troponins were negative.  His EKG was reassuring.  He was told to hold his antihypertensives he was discharged home.  He was seen by cardiology a few days later.  His Cialis was discontinued.  The patient tells me that this happened also in June.  Both times he felt nauseated prior to the episode.  In June he noticed his heart rate was quite high before he felt lightheaded.  In January his heart rate was low before his  blood pressure bottomed out.  He feels fine otherwise.  He denies any shortness of breath or exertional angina.  He does report daily palpitations and his heart rate does increase to the 110s at times.  He denies any peripheral edema or paroxysmal nocturnal dyspnea.  He is compliant with his CPAP but does not like wearing it.  He has had no severe bleeding or bruising.  Plan: Obtain monitor, check CBC, check lipid panel  and LFTs.  May 2025:  Patient consents to use of AI scribe. In the interim his monitor was reassuring with no evidence of high-grade heart block or bradycardia.  It did show SVT, isolated PACs, and PVCs however there were no patient triggered events.  His CBC was also within normal limits.   He has a history of atrial fibrillation and was previously seen in the emergency department in January for lightheadedness experienced during dinner. At that time, a heart monitor was used, and blood counts and cholesterol levels were checked, all of which were normal. The monitor showed a few extra heartbeats but nothing significant enough to cause his symptoms. Since then, he has been doing well overall. In the past month, he experienced another episode of lightheadedness, initially thought to be related to a sinus infection. During this episode, he had difficulty focusing and felt as though his oxygen was not reaching his brain. His wife noted confusion, as he was unsure if he had called her. He was taken to [facility] in [place], where his symptoms were attributed to sinusitis. He has a history of a double hernia, which has been monitored for several years. The hernia has become more painful recently, prompting a scheduled consultation with a surgeon. 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 He experiences some shortness of breath, particularly when walking from his house to his outbuildings, which are about two acres apart. This sometimes leads to lightheadedness. He has reduced his physical activities, such as mowing the lawn, due to these symptoms. He is currently taking olmesartan (Benicar). No recent hospitalizations related to his heart condition. No significant chest pain, but he notes occasional shortness of breath and a decline in short-term memory.          Current Medications: Current Meds  Medication Sig   acetaminophen  (TYLENOL ) 500 MG tablet Take 500-1,000  mg by mouth See admin instructions. Take 2 tablets (1000 mg) by mouth in the morning (scheduled), may take an additional dose in the evening if needed for pain.   atorvastatin  (LIPITOR) 20 MG tablet Take 20 mg by mouth every evening.    diltiazem  (CARDIZEM  CD) 120 MG 24 hr capsule Take 1 capsule (120 mg total) by mouth daily.   ELIQUIS  5 MG TABS tablet TAKE 1 TABLET BY MOUTH TWICE DAILY   midodrine (PROAMATINE) 5 MG tablet SMARTSIG:1 Tablet(s) By Mouth   Multiple Vitamin (MULTIVITAMIN WITH MINERALS) TABS tablet Take 1 tablet by mouth daily.   olmesartan (BENICAR) 20 MG tablet Take 1 tablet by mouth daily.   Omega-3 Fatty Acids (FISH OIL) 1200 MG CAPS Take 1,200 mg by mouth daily.   omeprazole (PRILOSEC) 20 MG capsule Take 20 mg by mouth daily.     Review of Systems:   Please see the history of present illness.    All other systems reviewed and are negative.     EKGs/Labs/Other Test Reviewed:   EKG: EKG from January 2025 demonstrates normal sinus rhythm  EKG Interpretation Date/Time:  Ventricular Rate:    PR Interval:    QRS Duration:    QT Interval:    QTC Calculation:   R Axis:      Text Interpretation:           Risk Assessment/Calculations:    CHA2DS2-VASc Score = 3  This indicates a 3.2% annual risk of stroke. The patient's score is based upon: CHF History: 0 HTN History: 1 Diabetes History: 0 Stroke History: 0 Vascular Disease History: 0 Age Score: 2 Gender Score: 0    STOP-Bang Score:          Physical Exam:   VS:  BP 130/75   Pulse (!) 58   Ht 5\' 8"  (1.727 m)   Wt 191 lb (86.6 kg)   SpO2 97%   BMI 29.04 kg/m        Wt Readings from Last 3 Encounters:  08/16/23 191 lb (86.6 kg)  05/06/23 187 lb 3.2 oz (84.9 kg)  04/06/23 189 lb 3.2 oz (85.8 kg)      GENERAL:  No apparent distress, AOx3 HEENT:  No carotid bruits, +2 carotid impulses, no scleral icterus CAR: RRR no murmurs, gallops, rubs, or thrills RES:  Clear to auscultation  bilaterally ABD:  Soft, nontender, nondistended, positive bowel sounds x 4 VASC:  +2 radial pulses, +2 carotid pulses NEURO:  CN 2-12 grossly intact; motor and sensory grossly intact PSYCH:  No active depression or anxiety EXT:  No edema, ecchymosis, or cyanosis  Signed, Makenzy Krist K Mavrick Mcquigg, MD  08/16/2023 2:58 PM    [facility] [address] [place], [place], [place]  [address] Phone: [phone]; Fax: [phone]   Note:  This document was prepared using Dragon voice recognition software and may include unintentional dictation errors.

## 2023-08-16 ENCOUNTER — Ambulatory Visit: Payer: Medicare Other | Attending: Internal Medicine | Admitting: Internal Medicine

## 2023-08-16 ENCOUNTER — Encounter: Payer: Self-pay | Admitting: Internal Medicine

## 2023-08-16 VITALS — BP 130/75 | HR 58 | Ht 68.0 in | Wt 191.0 lb

## 2023-08-16 DIAGNOSIS — I480 Paroxysmal atrial fibrillation: Secondary | ICD-10-CM | POA: Diagnosis not present

## 2023-08-16 DIAGNOSIS — D6869 Other thrombophilia: Secondary | ICD-10-CM

## 2023-08-16 DIAGNOSIS — R55 Syncope and collapse: Secondary | ICD-10-CM

## 2023-08-16 DIAGNOSIS — G4733 Obstructive sleep apnea (adult) (pediatric): Secondary | ICD-10-CM | POA: Diagnosis not present

## 2023-08-16 DIAGNOSIS — I5189 Other ill-defined heart diseases: Secondary | ICD-10-CM | POA: Diagnosis not present

## 2023-08-16 DIAGNOSIS — E785 Hyperlipidemia, unspecified: Secondary | ICD-10-CM

## 2023-08-16 DIAGNOSIS — Z6826 Body mass index (BMI) 26.0-26.9, adult: Secondary | ICD-10-CM

## 2023-08-16 MED ORDER — EMPAGLIFLOZIN 10 MG PO TABS: 10.0000 mg | ORAL_TABLET | Freq: Every day | ORAL | 3 refills | Status: DC

## 2023-08-16 MED ORDER — EMPAGLIFLOZIN 10 MG PO TABS: 10.0000 mg | ORAL_TABLET | Freq: Every day | ORAL | Status: AC

## 2023-08-16 NOTE — Addendum Note (Signed)
 Addended by: Loura Pitt W on: 08/16/2023 03:15 PM   Modules accepted: Orders

## 2023-08-16 NOTE — Patient Instructions (Signed)
 Medication Instructions:  Your physician has recommended you make the following change in your medication:   1) START empagliflozin  (Jardiance ) 10 mg daily before breakfast  *If you need a refill on your cardiac medications before your next appointment, please call your pharmacy*  Follow-Up: At [facility], you and your health needs are our priority.  As part of our continuing mission to provide you with exceptional heart care, our providers are all part of one team.  This team includes your primary Cardiologist (physician) and Advanced Practice Providers or APPs (Physician Assistants and Nurse Practitioners) who all work together to provide you with the care you need, when you need it.  Your next appointment:   6 month(s)  Provider:   Lovette Rud, PA-C, Charles Connor, NP, Graciela Lava, PA-C, Theotis Flake, PA-C, Marlyse Single, PA-C, or Leala Prince, PA-C      We recommend signing up for the patient portal called "MyChart".  Sign up information is provided on this After Visit Summary.  MyChart is used to connect with patients for Virtual Visits (Telemedicine).  Patients are able to view lab/test results, encounter notes, upcoming appointments, etc.  Non-urgent messages can be sent to your provider as well.   To learn more about what you can do with MyChart, go to [email or web address].

## 2023-08-19 DIAGNOSIS — K4090 Unilateral inguinal hernia, without obstruction or gangrene, not specified as recurrent: Secondary | ICD-10-CM | POA: Diagnosis not present

## 2023-09-05 ENCOUNTER — Other Ambulatory Visit: Payer: Self-pay | Admitting: Internal Medicine

## 2023-09-05 DIAGNOSIS — I480 Paroxysmal atrial fibrillation: Secondary | ICD-10-CM

## 2023-09-06 ENCOUNTER — Other Ambulatory Visit (HOSPITAL_COMMUNITY): Payer: Self-pay

## 2023-09-06 NOTE — Telephone Encounter (Signed)
 Prescription refill request for Eliquis  received. Indication: PAF Last office visit: 08/16/23  Marzette Solders MD Scr: 1.02 on 07/27/23  Epic Age: 83 Weight: 86.6kg  Based on above findings Eliquis  5mg  twice daily is the appropriate dose.  Refill approved.

## 2023-09-15 DIAGNOSIS — Z7901 Long term (current) use of anticoagulants: Secondary | ICD-10-CM | POA: Diagnosis not present

## 2023-09-15 DIAGNOSIS — Z01818 Encounter for other preprocedural examination: Secondary | ICD-10-CM | POA: Diagnosis not present

## 2023-09-15 DIAGNOSIS — I10 Essential (primary) hypertension: Secondary | ICD-10-CM | POA: Diagnosis not present

## 2023-09-15 DIAGNOSIS — E785 Hyperlipidemia, unspecified: Secondary | ICD-10-CM | POA: Diagnosis not present

## 2023-09-15 DIAGNOSIS — K4090 Unilateral inguinal hernia, without obstruction or gangrene, not specified as recurrent: Secondary | ICD-10-CM | POA: Diagnosis not present

## 2023-09-15 DIAGNOSIS — R7303 Prediabetes: Secondary | ICD-10-CM | POA: Diagnosis not present

## 2023-09-15 DIAGNOSIS — G4733 Obstructive sleep apnea (adult) (pediatric): Secondary | ICD-10-CM | POA: Diagnosis not present

## 2023-09-15 DIAGNOSIS — K219 Gastro-esophageal reflux disease without esophagitis: Secondary | ICD-10-CM | POA: Diagnosis not present

## 2023-09-15 DIAGNOSIS — I4891 Unspecified atrial fibrillation: Secondary | ICD-10-CM | POA: Diagnosis not present

## 2023-09-27 ENCOUNTER — Other Ambulatory Visit (HOSPITAL_COMMUNITY): Payer: Self-pay

## 2023-09-27 ENCOUNTER — Other Ambulatory Visit: Payer: Self-pay | Admitting: Internal Medicine

## 2023-09-27 MED ORDER — DILTIAZEM HCL ER COATED BEADS 120 MG PO CP24
120.0000 mg | ORAL_CAPSULE | Freq: Every day | ORAL | 2 refills | Status: AC
  Filled 2023-09-27 – 2023-12-07 (×2): qty 90, 90d supply, fill #0
  Filled 2024-03-06: qty 90, 90d supply, fill #1

## 2023-09-28 ENCOUNTER — Other Ambulatory Visit (HOSPITAL_COMMUNITY): Payer: Self-pay

## 2023-09-29 DIAGNOSIS — K458 Other specified abdominal hernia without obstruction or gangrene: Secondary | ICD-10-CM | POA: Diagnosis not present

## 2023-09-29 DIAGNOSIS — K4090 Unilateral inguinal hernia, without obstruction or gangrene, not specified as recurrent: Secondary | ICD-10-CM | POA: Diagnosis not present

## 2023-09-29 DIAGNOSIS — G8918 Other acute postprocedural pain: Secondary | ICD-10-CM | POA: Diagnosis not present

## 2023-09-29 DIAGNOSIS — Z87891 Personal history of nicotine dependence: Secondary | ICD-10-CM | POA: Diagnosis not present

## 2023-09-29 DIAGNOSIS — I4891 Unspecified atrial fibrillation: Secondary | ICD-10-CM | POA: Diagnosis not present

## 2023-09-29 DIAGNOSIS — I10 Essential (primary) hypertension: Secondary | ICD-10-CM | POA: Diagnosis not present

## 2023-09-29 DIAGNOSIS — G4730 Sleep apnea, unspecified: Secondary | ICD-10-CM | POA: Diagnosis not present

## 2023-09-29 DIAGNOSIS — K219 Gastro-esophageal reflux disease without esophagitis: Secondary | ICD-10-CM | POA: Diagnosis not present

## 2023-09-29 DIAGNOSIS — Z7901 Long term (current) use of anticoagulants: Secondary | ICD-10-CM | POA: Diagnosis not present

## 2023-10-14 DIAGNOSIS — I480 Paroxysmal atrial fibrillation: Secondary | ICD-10-CM | POA: Diagnosis not present

## 2023-10-14 DIAGNOSIS — I10 Essential (primary) hypertension: Secondary | ICD-10-CM | POA: Diagnosis not present

## 2023-10-14 DIAGNOSIS — E785 Hyperlipidemia, unspecified: Secondary | ICD-10-CM | POA: Diagnosis not present

## 2023-10-14 DIAGNOSIS — N182 Chronic kidney disease, stage 2 (mild): Secondary | ICD-10-CM | POA: Diagnosis not present

## 2023-10-15 DIAGNOSIS — I10 Essential (primary) hypertension: Secondary | ICD-10-CM | POA: Diagnosis not present

## 2023-10-15 DIAGNOSIS — N182 Chronic kidney disease, stage 2 (mild): Secondary | ICD-10-CM | POA: Diagnosis not present

## 2023-10-15 DIAGNOSIS — I480 Paroxysmal atrial fibrillation: Secondary | ICD-10-CM | POA: Diagnosis not present

## 2023-10-15 DIAGNOSIS — E785 Hyperlipidemia, unspecified: Secondary | ICD-10-CM | POA: Diagnosis not present

## 2023-10-26 DIAGNOSIS — I10 Essential (primary) hypertension: Secondary | ICD-10-CM | POA: Diagnosis not present

## 2023-10-26 DIAGNOSIS — E860 Dehydration: Secondary | ICD-10-CM | POA: Diagnosis not present

## 2023-12-07 ENCOUNTER — Other Ambulatory Visit (HOSPITAL_COMMUNITY): Payer: Self-pay

## 2024-01-19 DIAGNOSIS — I480 Paroxysmal atrial fibrillation: Secondary | ICD-10-CM | POA: Diagnosis not present

## 2024-01-19 DIAGNOSIS — E7849 Other hyperlipidemia: Secondary | ICD-10-CM | POA: Diagnosis not present

## 2024-01-19 DIAGNOSIS — N182 Chronic kidney disease, stage 2 (mild): Secondary | ICD-10-CM | POA: Diagnosis not present

## 2024-01-19 DIAGNOSIS — I10 Essential (primary) hypertension: Secondary | ICD-10-CM | POA: Diagnosis not present

## 2024-03-03 ENCOUNTER — Other Ambulatory Visit: Payer: Self-pay | Admitting: Internal Medicine

## 2024-03-03 DIAGNOSIS — I480 Paroxysmal atrial fibrillation: Secondary | ICD-10-CM

## 2024-03-03 NOTE — Telephone Encounter (Signed)
 Eliquis  5mg  refill request received. Patient is 83 years old, weight-86.6kg, Crea-1.09 on 02/28/24 via [facility] from [facility], [place], and last seen by Dr. Wendel on 08/16/23. Dose is appropriate based on dosing criteria. Will send in refill to requested pharmacy.

## 2024-03-06 ENCOUNTER — Other Ambulatory Visit (HOSPITAL_COMMUNITY): Payer: Self-pay

## 2024-05-22 ENCOUNTER — Ambulatory Visit: Admitting: Physician Assistant
